# Patient Record
Sex: Female | Born: 1950 | Race: White | Hispanic: No | Marital: Married | Smoking: Current every day smoker
Health system: Southern US, Community
[De-identification: ages and names within clinical notes are randomized; demographics above are authoritative.]

## PROBLEM LIST (undated history)

## (undated) DIAGNOSIS — I10 Essential (primary) hypertension: Secondary | ICD-10-CM

## (undated) DIAGNOSIS — F909 Attention-deficit hyperactivity disorder, unspecified type: Secondary | ICD-10-CM

## (undated) DIAGNOSIS — I739 Peripheral vascular disease, unspecified: Secondary | ICD-10-CM

## (undated) DIAGNOSIS — M810 Age-related osteoporosis without current pathological fracture: Secondary | ICD-10-CM

## (undated) DIAGNOSIS — F32A Depression, unspecified: Secondary | ICD-10-CM

## (undated) DIAGNOSIS — I2510 Atherosclerotic heart disease of native coronary artery without angina pectoris: Secondary | ICD-10-CM

## (undated) DIAGNOSIS — F329 Major depressive disorder, single episode, unspecified: Secondary | ICD-10-CM

## (undated) DIAGNOSIS — J449 Chronic obstructive pulmonary disease, unspecified: Secondary | ICD-10-CM

## (undated) DIAGNOSIS — Z720 Tobacco use: Secondary | ICD-10-CM

## (undated) HISTORY — DX: Chronic obstructive pulmonary disease, unspecified: J44.9

## (undated) HISTORY — DX: Depression, unspecified: F32.A

## (undated) HISTORY — DX: Attention-deficit hyperactivity disorder, unspecified type: F90.9

## (undated) HISTORY — DX: Peripheral vascular disease, unspecified: I73.9

## (undated) HISTORY — DX: Age-related osteoporosis without current pathological fracture: M81.0

## (undated) HISTORY — DX: Atherosclerotic heart disease of native coronary artery without angina pectoris: I25.10

## (undated) HISTORY — PX: OVARY SURGERY: SHX727

## (undated) HISTORY — DX: Essential (primary) hypertension: I10

## (undated) HISTORY — DX: Tobacco use: Z72.0

---
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
## 1973-02-22 HISTORY — PX: APPENDECTOMY: SHX54

## 1973-02-22 HISTORY — PX: TOTAL ABDOMINAL HYSTERECTOMY: SHX209

## 1983-02-23 HISTORY — PX: VENTRAL HERNIA REPAIR: SHX424

## 2002-09-05 ENCOUNTER — Emergency Department (HOSPITAL_COMMUNITY): Admission: EM | Admit: 2002-09-05 | Discharge: 2002-09-06 | Payer: Self-pay | Admitting: Emergency Medicine

## 2004-04-28 ENCOUNTER — Emergency Department (HOSPITAL_COMMUNITY): Admission: EM | Admit: 2004-04-28 | Discharge: 2004-04-28 | Payer: Self-pay | Admitting: Emergency Medicine

## 2005-02-22 HISTORY — PX: CHOLECYSTECTOMY: SHX55

## 2005-06-08 ENCOUNTER — Emergency Department (HOSPITAL_COMMUNITY): Admission: EM | Admit: 2005-06-08 | Discharge: 2005-06-09 | Payer: Self-pay | Admitting: Emergency Medicine

## 2005-07-02 ENCOUNTER — Emergency Department (HOSPITAL_COMMUNITY): Admission: EM | Admit: 2005-07-02 | Discharge: 2005-07-02 | Payer: Self-pay | Admitting: Emergency Medicine

## 2005-07-10 ENCOUNTER — Emergency Department (HOSPITAL_COMMUNITY): Admission: EM | Admit: 2005-07-10 | Discharge: 2005-07-10 | Payer: Self-pay | Admitting: Emergency Medicine

## 2006-12-30 IMAGING — CR DG CHEST 1V PORT
1 series · 1 of 1 positions shown · non-contrast
Comparison: None.

CLINICAL DATA: Chest pain, shortness of breath.

PORTABLE CHEST - 1 VIEW  [DATE]/[id number] hours:

[view not recorded]
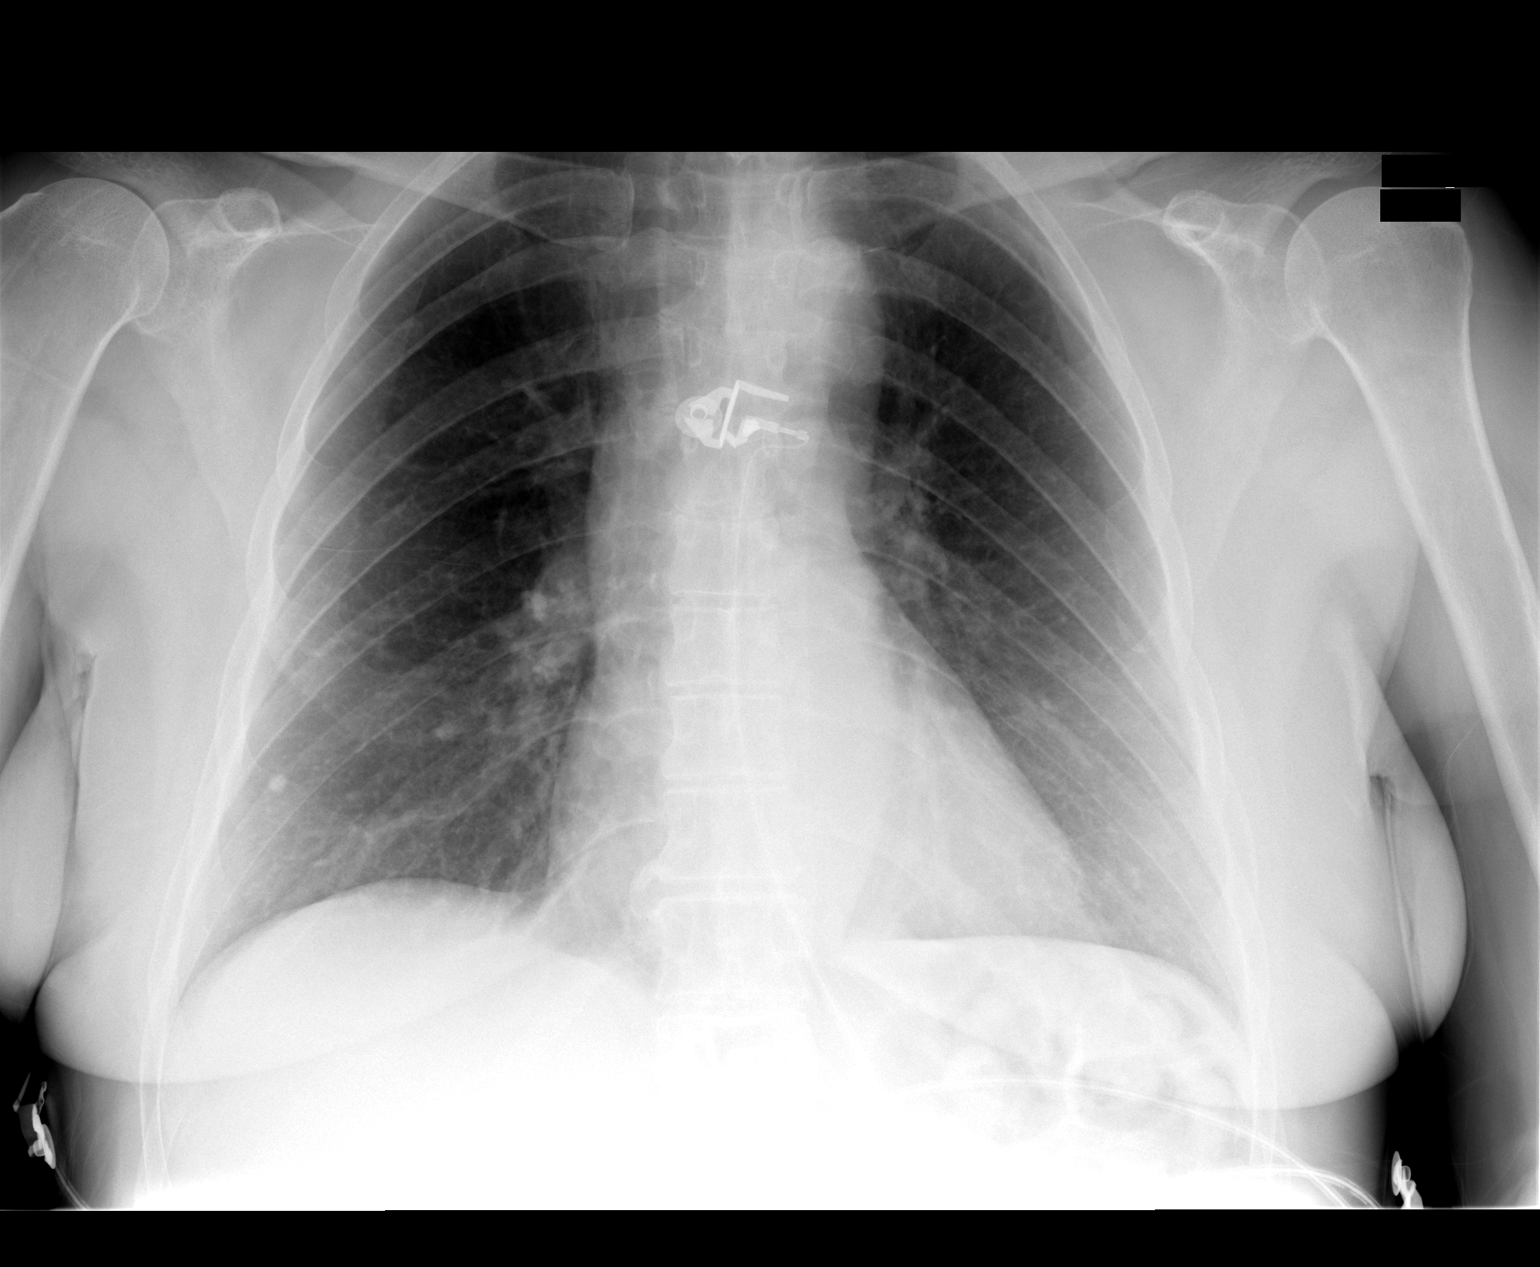

[1 of 1 positions shown; findings below may reference images not displayed]

FINDINGS: The heart size is normal. The thoracic aorta is mildly tortuous. The
hilar and mediastinal contours are otherwise unremarkable. A calcified granuloma
is present at the right lung base. The lungs are otherwise clear. There are no
pleural effusions.
IMPRESSION: Old granulomatous disease. No acute cardiopulmonary disease.

## 2008-02-23 HISTORY — PX: NECK SURGERY: SHX720

## 2009-01-09 ENCOUNTER — Ambulatory Visit (HOSPITAL_COMMUNITY): Admission: RE | Admit: 2009-01-09 | Discharge: 2009-01-10 | Payer: Self-pay | Admitting: Neurosurgery

## 2010-05-27 LAB — CBC
HCT: 42.5 % (ref 36.0–46.0)
MCHC: 35.4 g/dL (ref 30.0–36.0)
MCV: 95 fL (ref 78.0–100.0)

## 2010-05-27 LAB — URINALYSIS, ROUTINE W REFLEX MICROSCOPIC
Bilirubin Urine: NEGATIVE
Glucose, UA: NEGATIVE mg/dL
Nitrite: NEGATIVE
Protein, ur: NEGATIVE mg/dL
Specific Gravity, Urine: 1.021 (ref 1.005–1.030)
pH: 7 (ref 5.0–8.0)

## 2010-05-27 LAB — BASIC METABOLIC PANEL
Calcium: 9.2 mg/dL (ref 8.4–10.5)
Creatinine, Ser: 0.7 mg/dL (ref 0.4–1.2)
GFR calc Af Amer: >60 mL/min (ref >60)
GFR calc non Af Amer: >60 mL/min (ref >60)
Sodium: 140 mEq/L (ref 135–145)

## 2010-05-27 LAB — PROTIME-INR: INR: 1.02 (ref 0.00–1.49)

## 2018-09-19 HISTORY — PX: WRIST SURGERY: SHX841

## 2019-05-21 ENCOUNTER — Encounter: Payer: Self-pay | Admitting: *Deleted

## 2019-05-22 ENCOUNTER — Ambulatory Visit: Payer: Medicare HMO | Admitting: Cardiology

## 2019-05-22 ENCOUNTER — Encounter: Payer: Self-pay | Admitting: Cardiology

## 2019-05-22 ENCOUNTER — Telehealth: Payer: Self-pay | Admitting: Cardiology

## 2019-05-22 ENCOUNTER — Other Ambulatory Visit: Payer: Self-pay

## 2019-05-22 ENCOUNTER — Encounter: Payer: Self-pay | Admitting: *Deleted

## 2019-05-22 VITALS — BP 148/90 | HR 69 | Ht 63.5 in | Wt 151.0 lb

## 2019-05-22 DIAGNOSIS — R079 Chest pain, unspecified: Secondary | ICD-10-CM | POA: Diagnosis not present

## 2019-05-22 DIAGNOSIS — Z136 Encounter for screening for cardiovascular disorders: Secondary | ICD-10-CM

## 2019-05-22 NOTE — Progress Notes (Signed)
Clinical Summary Ms. Carrigan is a 69 y.o.female seen today as a new consult for the following medical problems.   1. Coronary  Atherosclerosis - 04/2019 CT chest incidental findings showed aortic atherosclerosis and 3 vessel CAD.  - can have some tightness in midchest. Can occur at rest. Moderate in severity. +SOB. Lasts a few minutes. Occurs few times a week - highest level of activity is hourse work, which she tolerates - no relation to eating. Not positional   CAD risk factors: Age, smoking, HTN   Past Medical History:  Diagnosis Date  . ADHD   . COPD (chronic obstructive pulmonary disease) ([place])   . Depression   . Hypertension   . Osteoporosis   . PVD (peripheral vascular disease) ([place])   . Tobacco abuse      Allergies  Allergen Reactions  . Ace Inhibitors Cough  . Sulfa Antibiotics Hives  . Penicillins Rash     Current Outpatient Medications  Medication Sig Dispense Refill  . alendronate (FOSAMAX) 70 MG tablet Take 70 mg by mouth once a week. Take with a full glass of water on an empty stomach.    Marland Kitchen buPROPion (WELLBUTRIN SR) 150 MG 12 hr tablet Take 150 mg by mouth 2 (two) times daily.    . calcium carbonate (CALTRATE 600) 1500 (600 Ca) MG TABS tablet Take 1,500 mg by mouth 2 (two) times daily with a meal.    . cyclobenzaprine (FLEXERIL) 10 MG tablet Take 10 mg by mouth 3 (three) times daily as needed for muscle spasms.    . DULoxetine (CYMBALTA) 60 MG capsule Take 60 mg by mouth 2 (two) times daily.    Marland Kitchen gabapentin (NEURONTIN) 300 MG capsule Take 300 mg by mouth 2 (two) times daily.    Marland Kitchen losartan-hydrochlorothiazide (HYZAAR) 100-12.5 MG tablet Take 1 tablet by mouth daily.    Marland Kitchen umeclidinium-vilanterol (ANORO ELLIPTA) 62.5-25 MCG/INH AEPB Inhale 1 puff into the lungs daily.     No current facility-administered medications for this visit.        Allergies  Allergen Reactions  . Ace Inhibitors Cough  . Sulfa Antibiotics Hives  . Penicillins Rash       Family History  Problem Relation Age of Onset  . Heart attack Father 79  . Heart attack Sister 50  . Hypertension Mother      Social History Ms. Limehouse reports that she has been smoking cigarettes. She has been smoking about 1.50 packs per day. She does not have any smokeless tobacco history on file. Ms. Fanta has no history on file for alcohol.   Review of Systems CONSTITUTIONAL: No weight loss, fever, chills, weakness or fatigue.  HEENT: Eyes: No visual loss, blurred vision, double vision or yellow sclerae.No hearing loss, sneezing, congestion, runny nose or sore throat.  SKIN: No rash or itching.  CARDIOVASCULAR: per hpi RESPIRATORY:per hpi GASTROINTESTINAL: No anorexia, nausea, vomiting or diarrhea. No abdominal pain or blood.  GENITOURINARY: No burning on urination, no polyuria NEUROLOGICAL: No headache, dizziness, syncope, paralysis, ataxia, numbness or tingling in the extremities. No change in bowel or bladder control.  MUSCULOSKELETAL: No muscle, back pain, joint pain or stiffness.  LYMPHATICS: No enlarged nodes. No history of splenectomy.  PSYCHIATRIC: No history of depression or anxiety.  ENDOCRINOLOGIC: No reports of sweating, cold or heat intolerance. No polyuria or polydipsia.  Marland Kitchen   Physical Examination Today's Vitals   05/22/19 1035  BP: (!) 148/90  Pulse: 69  SpO2: 97%  Weight: 151  lb (68.5 kg)  Height: 5' 3.5" (1.613 m)   Body mass index is 26.33 kg/m.  Gen: resting comfortably, no acute distress HEENT: no scleral icterus, pupils equal round and reactive, no palptable cervical adenopathy,  CV: RRR, no m/r/g no jvd Resp: Clear to auscultation bilaterally GI: abdomen is soft, non-tender, non-distended, normal bowel sounds, no hepatosplenomegaly MSK: extremities are warm, no edema.  Skin: warm, no rash Neuro:  no focal deficits Psych: appropriate affect     Assessment and Plan  1. Chest pain - incidental findings of 3 vessel CAD by recent  CT chest - reports some recent chest tightness, SOB - we will obtain a lexiscan to further evaluate for possible obstructive CAD - likely start ASA pending stress results - EKG today NSR, no ischemic changes  F/u pending stress results    Arnoldo Lenis, M.D.

## 2019-05-22 NOTE — Telephone Encounter (Signed)
Pre-cert Verification for the following procedure    LEXISCAN   DATE:06/01/2019  LOCATION: [facility]

## 2019-05-22 NOTE — Patient Instructions (Signed)
Your physician recommends that you schedule a follow-up appointment in: PENDING WITH DR BRANCH  Your physician recommends that you continue on your current medications as directed. Please refer to the Current Medication list given to you today.  Your physician has requested that you have a lexiscan myoview. For further information please visit [email or web address]. Please follow instruction sheet, as given.  Thank you for choosing [facility]!!    

## 2019-06-01 ENCOUNTER — Encounter (HOSPITAL_COMMUNITY)
Admission: RE | Admit: 2019-06-01 | Discharge: 2019-06-01 | Disposition: A | Discharge status: Home / Self Care | Payer: Medicare Other | Source: Ambulatory Visit | Attending: Cardiology | Admitting: Cardiology

## 2019-06-01 ENCOUNTER — Encounter (HOSPITAL_COMMUNITY): Payer: Self-pay

## 2019-06-01 ENCOUNTER — Other Ambulatory Visit: Payer: Self-pay

## 2019-06-01 ENCOUNTER — Encounter (HOSPITAL_BASED_OUTPATIENT_CLINIC_OR_DEPARTMENT_OTHER)
Admission: RE | Admit: 2019-06-01 | Discharge: 2019-06-01 | Disposition: A | Discharge status: Home / Self Care | Payer: Medicare Other | Source: Ambulatory Visit | Attending: Cardiology | Admitting: Cardiology

## 2019-06-01 DIAGNOSIS — R079 Chest pain, unspecified: Secondary | ICD-10-CM | POA: Diagnosis not present

## 2019-06-01 LAB — NM MYOCAR MULTI W/SPECT W/WALL MOTION / EF
LV dias vol: 91 mL (ref 46–106)
LV sys vol: 33 mL
Peak HR: 82 {beats}/min
RATE: 0.3
Rest HR: 55 {beats}/min
SDS: 6
SRS: 0
SSS: 6
TID: 1.21

## 2019-06-01 MED ORDER — TECHNETIUM TC 99M TETROFOSMIN IV KIT
30.0000 | PACK | Freq: Once | INTRAVENOUS | Status: AC | PRN
  Administered 2019-06-01: 11:00:00 29 via INTRAVENOUS

## 2019-06-01 MED ORDER — SODIUM CHLORIDE FLUSH 0.9 % IV SOLN
INTRAVENOUS | Status: AC
  Administered 2019-06-01: 11:00:00 10 mL via INTRAVENOUS
  Filled 2019-06-01: qty 10

## 2019-06-01 MED ORDER — TECHNETIUM TC 99M TETROFOSMIN IV KIT
10.0000 | PACK | Freq: Once | INTRAVENOUS | Status: AC | PRN
  Administered 2019-06-01: 09:00:00 10.3 via INTRAVENOUS

## 2019-06-01 MED ORDER — REGADENOSON 0.4 MG/5ML IV SOLN
INTRAVENOUS | Status: AC
  Administered 2019-06-01: 11:00:00 0.4 mg via INTRAVENOUS
  Filled 2019-06-01: qty 5

## 2019-06-07 ENCOUNTER — Telehealth: Payer: Self-pay | Admitting: Cardiology

## 2019-06-07 NOTE — Telephone Encounter (Signed)
Patient called requesting test results of recent stress test.  Please call [phone]

## 2019-06-07 NOTE — Telephone Encounter (Signed)
Pt voiced understanding - will add to schedule - routed to pcp   Stress test suggest a possible small area of blockage. Can we do a virtual Friday at 840AM to discuss findings and reassess symptoms, this is likely something we can treat with medications for now   Zandra Abts MD

## 2019-06-08 ENCOUNTER — Other Ambulatory Visit (HOSPITAL_COMMUNITY): Payer: Medicare Other

## 2019-06-08 ENCOUNTER — Telehealth: Payer: Self-pay | Admitting: Cardiology

## 2019-06-08 ENCOUNTER — Other Ambulatory Visit: Payer: Self-pay | Admitting: Cardiology

## 2019-06-08 ENCOUNTER — Encounter: Payer: Self-pay | Admitting: Cardiology

## 2019-06-08 ENCOUNTER — Telehealth (INDEPENDENT_AMBULATORY_CARE_PROVIDER_SITE_OTHER): Payer: Medicare Other | Admitting: Cardiology

## 2019-06-08 VITALS — Ht 63.5 in | Wt 151.0 lb

## 2019-06-08 DIAGNOSIS — R0789 Other chest pain: Secondary | ICD-10-CM | POA: Diagnosis not present

## 2019-06-08 MED ORDER — NITROGLYCERIN 0.4 MG SL SUBL: 0.4000 mg | SUBLINGUAL_TABLET | SUBLINGUAL | 3 refills | Status: AC | PRN

## 2019-06-08 MED ORDER — SODIUM CHLORIDE 0.9% FLUSH: 3.0000 mL | Freq: Two times a day (BID) | INTRAVENOUS | Status: DC

## 2019-06-08 MED ORDER — ISOSORBIDE MONONITRATE ER 30 MG PO TB24: 15.0000 mg | ORAL_TABLET | Freq: Every day | ORAL | 1 refills | Status: DC

## 2019-06-08 NOTE — Telephone Encounter (Signed)
Pre-cert Verification for the following procedure    [facility] 4/20 @7 :30 AM DR Claiborne Billings

## 2019-06-08 NOTE — Patient Instructions (Addendum)
Your physician recommends that you schedule a follow-up appointment in: [place] has recommended you make the following change in your medication:   START ASPIRIN 81 MG DAILY   START IMDUR 15 MG (1/2 TABLET) DAILY   TAKE NITROGLYCERIN 0.4 MG UNDER THE TONGUE EVERY 5-10 MINS AS NEEDED FOR CHEST PAIN  COVID TEST SCHEDULED FOR 06/11/2019 @ 8:55AM ACROSS FROM [place]  Your physician has requested that you have a cardiac catheterization SCHEDULED AT [facility] Tuesday 06/12/2019 PLEASE ARRIVE AT 5:30AM SHORT STAY ENTRANCE A - TAKE YOUR ASPIRIN AND ALL OTHER MEDICATION WITH SIPS OF WATER, MUST HAVE SOMEONE TO DRIVE YOU HOME, BRING AN OVER NIGHT BAGS AND MEDICATIONS, NOTHING TO EAT OR DRINK AFTER MIDNIGHT. Cardiac catheterization is used to diagnose and/or treat various heart conditions. Doctors may recommend this procedure for a number of different reasons. The most common reason is to evaluate chest pain. Chest pain can be a symptom of coronary artery disease (CAD), and cardiac catheterization can show whether plaque is narrowing or blocking your heart's arteries. This procedure is also used to evaluate the valves, as well as measure the blood flow and oxygen levels in different parts of your heart. For further information please visit [email or web address]. Please follow instruction sheet, as given.  Your physician recommends that you return for lab work CBC/BMP Monday AT [facility]   Thank you for choosing [facility]!!

## 2019-06-08 NOTE — H&P (View-Only) (Signed)
Virtual Visit via Telephone Note   This visit type was conducted due to national recommendations for restrictions regarding the COVID-19 Pandemic (e.g. social distancing) in an effort to limit this patient's exposure and mitigate transmission in our community.  Due to her co-morbid illnesses, this patient is at least at moderate risk for complications without adequate follow up.  This format is felt to be most appropriate for this patient at this time.  The patient did not have access to video technology/had technical difficulties with video requiring transitioning to audio format only (telephone).  All issues noted in this document were discussed and addressed.  No physical exam could be performed with this format.  Please refer to the patient's chart for her  consent to telehealth for [facility].   The patient was identified using 2 identifiers.  Date:  06/08/2019   ID:  Nimsy, Dross [birth year], MRN [id number]  Patient Location: Home Provider Location: Office  PCP:  Caryl Bis, MD  Cardiologist:  Dr Carlyle Dolly MD Electrophysiologist:  None   Evaluation Performed:  Follow-Up Visit  Chief Complaint:  Follow up visit  History of Present Illness:    Paige Zimmerman is a 69 y.o. female seen today for follow up of the following medical problems.  1. Coronary  Atherosclerosis - 04/2019 CT chest incidental findings showed aortic atherosclerosis and 3 vessel CAD.  - can have some tightness in midchest. Can occur at rest. Moderate in severity. +SOB. Lasts a few minutes. Occurs few times a week - highest level of activity is hourse work, which she tolerates - no relation to eating. Not positional   CAD risk factors: Age, smoking x 50 years, HTN,   - 05/2019 nuclear stress mild to moderate ischemia -since last visit some progression of symptoms. - chest pain has some increase in frequency. Symptoms with lower levels of exertion. Some     The patient does not have  symptoms concerning for COVID-19 infection (fever, chills, cough, or new shortness of breath).    Past Medical History:  Diagnosis Date  . ADHD   . COPD (chronic obstructive pulmonary disease) ([place])   . Depression   . Hypertension   . Osteoporosis   . PVD (peripheral vascular disease) ([place])   . Tobacco abuse       Current Meds  Medication Sig  . ALBUTEROL IN Inhale into the lungs.  Marland Kitchen alendronate (FOSAMAX) 70 MG tablet Take 70 mg by mouth once a week. Take with a full glass of water on an empty stomach.  Marland Kitchen buPROPion (WELLBUTRIN SR) 150 MG 12 hr tablet Take 150 mg by mouth 2 (two) times daily.  . calcium carbonate (CALTRATE 600) 1500 (600 Ca) MG TABS tablet Take 1,500 mg by mouth 2 (two) times daily with a meal.  . cetirizine (ZYRTEC) 10 MG tablet Take 10 mg by mouth daily.  . DULoxetine (CYMBALTA) 60 MG capsule Take 60 mg by mouth 2 (two) times daily.  Marland Kitchen gabapentin (NEURONTIN) 300 MG capsule Take 300 mg by mouth 2 (two) times daily.  Marland Kitchen losartan-hydrochlorothiazide (HYZAAR) 100-12.5 MG tablet Take 1 tablet by mouth daily.  Marland Kitchen umeclidinium-vilanterol (ANORO ELLIPTA) 62.5-25 MCG/INH AEPB Inhale 1 puff into the lungs daily.     Allergies:   Ace inhibitors, Sulfa antibiotics, and Penicillins   Social History   Tobacco Use  . Smoking status: Current Every Day Smoker    Packs/day: 1.50    Types: Cigarettes  . Smokeless tobacco: Never Used  Substance Use Topics  . Alcohol use: Not on file  . Drug use: Not on file     Family Hx: The patient's family history includes Heart attack (age of onset: 79) in her sister; Heart attack (age of onset: 28) in her father; Hypertension in her mother.  ROS:   Please see the history of present illness.     All other systems reviewed and are negative.   Prior CV studies:   The following studies were reviewed today:  05/2019 nuclear stress  There was no ST segment deviation noted during stress.  Findings consistent with mild to moderate  anterior ischemia.  The left ventricular ejection fraction is normal (55-65%).  Low to intermediate risk study  Labs/Other Tests and Data Reviewed:    EKG:  No ECG reviewed.  Recent Labs: No results found for requested labs within last 8760 hours.   Recent Lipid Panel No results found for: CHOL, TRIG, HDL, CHOLHDL, LDLCALC, LDLDIRECT  Wt Readings from Last 3 Encounters:  06/08/19 151 lb (68.5 kg)  05/22/19 151 lb (68.5 kg)     Objective:    Vital Signs:  Ht 5' 3.5" (1.613 m)   Wt 151 lb (68.5 kg)   BMI 26.33 kg/m    Normal affect. Normal speech pattern and tone. Comfortable, no apparent distress. No audible signs of sob or wheezing.   ASSESSMENT & PLAN:    1. Chest pain - incidental findings of 3 vessel CAD by recent CT chest - nuclear stress with mild to moderate anterior ischemia - some gradual progression of symptoms of the last few weeks - given progression of symptoms will plan for heart cath - start ASA 81mg , imdur 15mg , and SL NG prn.    I have reviewed the risks, indications, and alternatives to cardiac catheterization, possible angioplasty, and stenting with the patient and her daughter today. Risks include but are not limited to bleeding, infection, vascular injury, stroke, myocardial infection, arrhythmia, kidney injury, radiation-related injury in the case of prolonged fluoroscopy use, emergency cardiac surgery, and death. The patient understands the risks of serious complication is 1-2 in 123XX123 with diagnostic cardiac cath and 1-2% or less with angioplasty/stenting.   COVID-19 Education: The signs and symptoms of COVID-19 were discussed with the patient and how to seek care for testing (follow up with PCP or arrange E-visit).  The importance of social distancing was discussed today.  Time:   Today, I have spent 21 minutes with the patient with telehealth technology discussing the above problems.     Medication Adjustments/Labs and Tests Ordered: Current  medicines are reviewed at length with the patient today.  Concerns regarding medicines are outlined above.   Tests Ordered: No orders of the defined types were placed in this encounter.   Medication Changes: No orders of the defined types were placed in this encounter.   Follow Up:  In Person in 3 week(s)  Signed, Carlyle Dolly, MD  06/08/2019 9:33 AM    [place]

## 2019-06-08 NOTE — Progress Notes (Addendum)
Virtual Visit via Telephone Note   This visit type was conducted due to national recommendations for restrictions regarding the COVID-19 Pandemic (e.g. social distancing) in an effort to limit this patient's exposure and mitigate transmission in our community.  Due to her co-morbid illnesses, this patient is at least at moderate risk for complications without adequate follow up.  This format is felt to be most appropriate for this patient at this time.  The patient did not have access to video technology/had technical difficulties with video requiring transitioning to audio format only (telephone).  All issues noted in this document were discussed and addressed.  No physical exam could be performed with this format.  Please refer to the patient's chart for her  consent to telehealth for [facility].   The patient was identified using 2 identifiers.  Date:  06/08/2019   ID:  Paige, Zimmerman November 15, 1950, MRN [id number]  Patient Location: Home Provider Location: Office  PCP:  Caryl Bis, MD  Cardiologist:  Dr Carlyle Dolly MD Electrophysiologist:  None   Evaluation Performed:  Follow-Up Visit  Chief Complaint:  Follow up visit  History of Present Illness:    Paige Zimmerman is a 69 y.o. female seen today for follow up of the following medical problems.  1. Coronary  Atherosclerosis - 04/2019 CT chest incidental findings showed aortic atherosclerosis and 3 vessel CAD.  - can have some tightness in midchest. Can occur at rest. Moderate in severity. +SOB. Lasts a few minutes. Occurs few times a week - highest level of activity is hourse work, which she tolerates - no relation to eating. Not positional   CAD risk factors: Age, smoking x 50 years, HTN,   - 05/2019 nuclear stress mild to moderate ischemia -since last visit some progression of symptoms. - chest pain has some increase in frequency. Symptoms with lower levels of exertion. Some     The patient does not have  symptoms concerning for COVID-19 infection (fever, chills, cough, or new shortness of breath).    Past Medical History:  Diagnosis Date  . ADHD   . COPD (chronic obstructive pulmonary disease) ([place])   . Depression   . Hypertension   . Osteoporosis   . PVD (peripheral vascular disease) ([place])   . Tobacco abuse       Current Meds  Medication Sig  . ALBUTEROL IN Inhale into the lungs.  Marland Kitchen alendronate (FOSAMAX) 70 MG tablet Take 70 mg by mouth once a week. Take with a full glass of water on an empty stomach.  Marland Kitchen buPROPion (WELLBUTRIN SR) 150 MG 12 hr tablet Take 150 mg by mouth 2 (two) times daily.  . calcium carbonate (CALTRATE 600) 1500 (600 Ca) MG TABS tablet Take 1,500 mg by mouth 2 (two) times daily with a meal.  . cetirizine (ZYRTEC) 10 MG tablet Take 10 mg by mouth daily.  . DULoxetine (CYMBALTA) 60 MG capsule Take 60 mg by mouth 2 (two) times daily.  Marland Kitchen gabapentin (NEURONTIN) 300 MG capsule Take 300 mg by mouth 2 (two) times daily.  Marland Kitchen losartan-hydrochlorothiazide (HYZAAR) 100-12.5 MG tablet Take 1 tablet by mouth daily.  Marland Kitchen umeclidinium-vilanterol (ANORO ELLIPTA) 62.5-25 MCG/INH AEPB Inhale 1 puff into the lungs daily.     Allergies:   Ace inhibitors, Sulfa antibiotics, and Penicillins   Social History   Tobacco Use  . Smoking status: Current Every Day Smoker    Packs/day: 1.50    Types: Cigarettes  . Smokeless tobacco: Never Used  Substance Use Topics  . Alcohol use: Not on file  . Drug use: Not on file     Family Hx: The patient's family history includes Heart attack (age of onset: 18) in her sister; Heart attack (age of onset: 74) in her father; Hypertension in her mother.  ROS:   Please see the history of present illness.     All other systems reviewed and are negative.   Prior CV studies:   The following studies were reviewed today:  05/2019 nuclear stress  There was no ST segment deviation noted during stress.  Findings consistent with mild to moderate  anterior ischemia.  The left ventricular ejection fraction is normal (55-65%).  Low to intermediate risk study  Labs/Other Tests and Data Reviewed:    EKG:  No ECG reviewed.  Recent Labs: No results found for requested labs within last 8760 hours.   Recent Lipid Panel No results found for: CHOL, TRIG, HDL, CHOLHDL, LDLCALC, LDLDIRECT  Wt Readings from Last 3 Encounters:  06/08/19 151 lb (68.5 kg)  05/22/19 151 lb (68.5 kg)     Objective:    Vital Signs:  Ht 5' 3.5" (1.613 m)   Wt 151 lb (68.5 kg)   BMI 26.33 kg/m    Normal affect. Normal speech pattern and tone. Comfortable, no apparent distress. No audible signs of sob or wheezing.   ASSESSMENT & PLAN:    1. Chest pain - incidental findings of 3 vessel CAD by recent CT chest - nuclear stress with mild to moderate anterior ischemia - some gradual progression of symptoms of the last few weeks - given progression of symptoms will plan for heart cath - start ASA 81mg , imdur 15mg , and SL NG prn.    I have reviewed the risks, indications, and alternatives to cardiac catheterization, possible angioplasty, and stenting with the patient and her daughter today. Risks include but are not limited to bleeding, infection, vascular injury, stroke, myocardial infection, arrhythmia, kidney injury, radiation-related injury in the case of prolonged fluoroscopy use, emergency cardiac surgery, and death. The patient understands the risks of serious complication is 1-2 in 123XX123 with diagnostic cardiac cath and 1-2% or less with angioplasty/stenting.   COVID-19 Education: The signs and symptoms of COVID-19 were discussed with the patient and how to seek care for testing (follow up with PCP or arrange E-visit).  The importance of social distancing was discussed today.  Time:   Today, I have spent 21 minutes with the patient with telehealth technology discussing the above problems.     Medication Adjustments/Labs and Tests Ordered: Current  medicines are reviewed at length with the patient today.  Concerns regarding medicines are outlined above.   Tests Ordered: No orders of the defined types were placed in this encounter.   Medication Changes: No orders of the defined types were placed in this encounter.   Follow Up:  In Person in 3 week(s)  Signed, Carlyle Dolly, MD  06/08/2019 9:33 AM    [place]

## 2019-06-11 ENCOUNTER — Telehealth: Payer: Self-pay | Admitting: Cardiology

## 2019-06-11 ENCOUNTER — Other Ambulatory Visit: Payer: Self-pay | Admitting: Cardiology

## 2019-06-11 ENCOUNTER — Other Ambulatory Visit (HOSPITAL_COMMUNITY)
Admission: RE | Admit: 2019-06-11 | Discharge: 2019-06-11 | Disposition: A | Discharge status: Home / Self Care | Payer: Medicare Other | Source: Ambulatory Visit | Attending: Cardiovascular Disease | Admitting: Cardiovascular Disease

## 2019-06-11 ENCOUNTER — Other Ambulatory Visit: Payer: Self-pay

## 2019-06-11 ENCOUNTER — Other Ambulatory Visit (HOSPITAL_COMMUNITY)
Admission: RE | Admit: 2019-06-11 | Discharge: 2019-06-11 | Disposition: A | Discharge status: Home / Self Care | Payer: Medicare Other | Source: Ambulatory Visit | Attending: Cardiology | Admitting: Cardiology

## 2019-06-11 DIAGNOSIS — R0789 Other chest pain: Secondary | ICD-10-CM | POA: Insufficient documentation

## 2019-06-11 LAB — BASIC METABOLIC PANEL
Anion gap: 10 (ref 5–15)
BUN: 18 mg/dL (ref 8–23)
CO2: 27 mmol/L (ref 22–32)
Calcium: 9.1 mg/dL (ref 8.9–10.3)
Chloride: 99 mmol/L (ref 98–111)
Creatinine, Ser: 0.63 mg/dL (ref 0.44–1.00)
GFR calc Af Amer: >60 mL/min (ref >60)
GFR calc non Af Amer: >60 mL/min (ref >60)
Glucose, Bld: 113 mg/dL — ABNORMAL HIGH (ref 70–99)
Potassium: 4.1 mmol/L (ref 3.5–5.1)
Sodium: 136 mmol/L (ref 135–145)

## 2019-06-11 LAB — CBC
HCT: 43.6 % (ref 36.0–46.0)
Hemoglobin: 14.4 g/dL (ref 12.0–15.0)
MCH: 32.4 pg (ref 26.0–34.0)
MCHC: 33 g/dL (ref 30.0–36.0)
MCV: 98 fL (ref 80.0–100.0)
Platelets: 253 10*3/uL (ref 150–400)
RBC: 4.45 MIL/uL (ref 3.87–5.11)
RDW: 13.3 % (ref 11.5–15.5)
WBC: 4.8 10*3/uL (ref 4.0–10.5)
nRBC: 0 % (ref 0.0–0.2)

## 2019-06-11 NOTE — Telephone Encounter (Signed)
Patient called stating that her daughter spoke with Dr. Harl Bowie  In regards to cancelling her upcoming cath for 06/12/2019.

## 2019-06-11 NOTE — Telephone Encounter (Addendum)
Request to change heart cath date until Friday New appointment for heart cath is Friday, 06/15/19 10:30 am with Irish Lack

## 2019-06-11 NOTE — Telephone Encounter (Signed)
Patient informed and verbalized understanding of new date and times for covid test and heart cath.

## 2019-06-12 ENCOUNTER — Other Ambulatory Visit: Payer: Self-pay

## 2019-06-12 ENCOUNTER — Other Ambulatory Visit (HOSPITAL_COMMUNITY)
Admission: RE | Admit: 2019-06-12 | Discharge: 2019-06-12 | Disposition: A | Discharge status: Home / Self Care | Payer: Medicare Other | Source: Ambulatory Visit | Attending: Cardiovascular Disease | Admitting: Cardiovascular Disease

## 2019-06-12 DIAGNOSIS — Z01812 Encounter for preprocedural laboratory examination: Secondary | ICD-10-CM | POA: Diagnosis not present

## 2019-06-12 DIAGNOSIS — Z20822 Contact with and (suspected) exposure to covid-19: Secondary | ICD-10-CM | POA: Insufficient documentation

## 2019-06-13 LAB — SARS CORONAVIRUS 2 (TAT 6-24 HRS): SARS Coronavirus 2: NEGATIVE

## 2019-06-15 ENCOUNTER — Other Ambulatory Visit: Payer: Self-pay

## 2019-06-15 ENCOUNTER — Encounter (HOSPITAL_COMMUNITY)
Admission: RE | Disposition: A | Discharge status: Home / Self Care | Payer: Self-pay | Source: Home / Self Care | Attending: Interventional Cardiology

## 2019-06-15 ENCOUNTER — Ambulatory Visit (HOSPITAL_COMMUNITY)
Admission: RE | Admit: 2019-06-15 | Discharge: 2019-06-15 | Disposition: A | Discharge status: Home / Self Care | Payer: Medicare Other | Attending: Interventional Cardiology | Admitting: Interventional Cardiology

## 2019-06-15 ENCOUNTER — Telehealth: Payer: Self-pay | Admitting: *Deleted

## 2019-06-15 DIAGNOSIS — F17210 Nicotine dependence, cigarettes, uncomplicated: Secondary | ICD-10-CM | POA: Diagnosis not present

## 2019-06-15 DIAGNOSIS — I2510 Atherosclerotic heart disease of native coronary artery without angina pectoris: Secondary | ICD-10-CM | POA: Insufficient documentation

## 2019-06-15 DIAGNOSIS — M810 Age-related osteoporosis without current pathological fracture: Secondary | ICD-10-CM | POA: Insufficient documentation

## 2019-06-15 DIAGNOSIS — R9439 Abnormal result of other cardiovascular function study: Secondary | ICD-10-CM | POA: Diagnosis not present

## 2019-06-15 DIAGNOSIS — Z8249 Family history of ischemic heart disease and other diseases of the circulatory system: Secondary | ICD-10-CM | POA: Insufficient documentation

## 2019-06-15 DIAGNOSIS — R079 Chest pain, unspecified: Secondary | ICD-10-CM | POA: Diagnosis present

## 2019-06-15 DIAGNOSIS — Z880 Allergy status to penicillin: Secondary | ICD-10-CM | POA: Insufficient documentation

## 2019-06-15 DIAGNOSIS — J449 Chronic obstructive pulmonary disease, unspecified: Secondary | ICD-10-CM | POA: Diagnosis not present

## 2019-06-15 DIAGNOSIS — Z79899 Other long term (current) drug therapy: Secondary | ICD-10-CM | POA: Insufficient documentation

## 2019-06-15 DIAGNOSIS — R0789 Other chest pain: Secondary | ICD-10-CM

## 2019-06-15 DIAGNOSIS — F329 Major depressive disorder, single episode, unspecified: Secondary | ICD-10-CM | POA: Diagnosis not present

## 2019-06-15 DIAGNOSIS — I739 Peripheral vascular disease, unspecified: Secondary | ICD-10-CM | POA: Insufficient documentation

## 2019-06-15 DIAGNOSIS — Z882 Allergy status to sulfonamides status: Secondary | ICD-10-CM | POA: Diagnosis not present

## 2019-06-15 DIAGNOSIS — I10 Essential (primary) hypertension: Secondary | ICD-10-CM | POA: Insufficient documentation

## 2019-06-15 HISTORY — PX: LEFT HEART CATH AND CORONARY ANGIOGRAPHY: CATH118249

## 2019-06-15 SURGERY — LEFT HEART CATH AND CORONARY ANGIOGRAPHY
Anesthesia: LOCAL

## 2019-06-15 MED ORDER — ONDANSETRON HCL 4 MG/2ML IJ SOLN: 4.0000 mg | Freq: Four times a day (QID) | INTRAMUSCULAR | Status: DC | PRN

## 2019-06-15 MED ORDER — HEPARIN SODIUM (PORCINE) 1000 UNIT/ML IJ SOLN
INTRAMUSCULAR | Status: AC
  Filled 2019-06-15: qty 1

## 2019-06-15 MED ORDER — LIDOCAINE HCL (PF) 1 % IJ SOLN
INTRAMUSCULAR | Status: AC
  Filled 2019-06-15: qty 30

## 2019-06-15 MED ORDER — SODIUM CHLORIDE 0.9% FLUSH: 3.0000 mL | Freq: Two times a day (BID) | INTRAVENOUS | Status: DC

## 2019-06-15 MED ORDER — MIDAZOLAM HCL 2 MG/2ML IJ SOLN
INTRAMUSCULAR | Status: AC
  Filled 2019-06-15: qty 2

## 2019-06-15 MED ORDER — SODIUM CHLORIDE 0.9 % IV SOLN: 250.0000 mL | INTRAVENOUS | Status: DC | PRN

## 2019-06-15 MED ORDER — MIDAZOLAM HCL 2 MG/2ML IJ SOLN
INTRAMUSCULAR | Status: DC | PRN
  Administered 2019-06-15: 2 mg via INTRAVENOUS

## 2019-06-15 MED ORDER — IOHEXOL 350 MG/ML SOLN
INTRAVENOUS | Status: DC | PRN
  Administered 2019-06-15: 11:00:00 60 mL

## 2019-06-15 MED ORDER — SODIUM CHLORIDE 0.9% FLUSH: 3.0000 mL | INTRAVENOUS | Status: DC | PRN

## 2019-06-15 MED ORDER — ACETAMINOPHEN 325 MG PO TABS: 650.0000 mg | ORAL_TABLET | ORAL | Status: DC | PRN

## 2019-06-15 MED ORDER — FENTANYL CITRATE (PF) 100 MCG/2ML IJ SOLN
INTRAMUSCULAR | Status: AC
  Filled 2019-06-15: qty 2

## 2019-06-15 MED ORDER — SODIUM CHLORIDE 0.9 % WEIGHT BASED INFUSION
1.0000 mL/kg/h | INTRAVENOUS | Status: DC
  Administered 2019-06-15: 1 mL/kg/h via INTRAVENOUS

## 2019-06-15 MED ORDER — LABETALOL HCL 5 MG/ML IV SOLN: 10.0000 mg | INTRAVENOUS | Status: DC | PRN

## 2019-06-15 MED ORDER — HYDRALAZINE HCL 20 MG/ML IJ SOLN: 10.0000 mg | INTRAMUSCULAR | Status: DC | PRN

## 2019-06-15 MED ORDER — ASPIRIN 81 MG PO CHEW: 81.0000 mg | CHEWABLE_TABLET | ORAL | Status: DC

## 2019-06-15 MED ORDER — HEPARIN SODIUM (PORCINE) 1000 UNIT/ML IJ SOLN
INTRAMUSCULAR | Status: DC | PRN
  Administered 2019-06-15: 3500 [IU] via INTRAVENOUS

## 2019-06-15 MED ORDER — HEPARIN (PORCINE) IN NACL 1000-0.9 UT/500ML-% IV SOLN
INTRAVENOUS | Status: AC
  Filled 2019-06-15: qty 500

## 2019-06-15 MED ORDER — LIDOCAINE HCL (PF) 1 % IJ SOLN
INTRAMUSCULAR | Status: DC | PRN
  Administered 2019-06-15: 2 mL

## 2019-06-15 MED ORDER — FENTANYL CITRATE (PF) 100 MCG/2ML IJ SOLN
INTRAMUSCULAR | Status: DC | PRN
  Administered 2019-06-15: 25 ug via INTRAVENOUS

## 2019-06-15 MED ORDER — HEPARIN (PORCINE) IN NACL 1000-0.9 UT/500ML-% IV SOLN
INTRAVENOUS | Status: DC | PRN
  Administered 2019-06-15 (×2): 500 mL

## 2019-06-15 MED ORDER — VERAPAMIL HCL 2.5 MG/ML IV SOLN
INTRAVENOUS | Status: AC
  Filled 2019-06-15: qty 4

## 2019-06-15 MED ORDER — SODIUM CHLORIDE 0.9 % WEIGHT BASED INFUSION
3.0000 mL/kg/h | INTRAVENOUS | Status: AC
  Administered 2019-06-15: 09:00:00 3 mL/kg/h via INTRAVENOUS

## 2019-06-15 MED ORDER — VERAPAMIL HCL 2.5 MG/ML IV SOLN
INTRAVENOUS | Status: DC | PRN
  Administered 2019-06-15: 10 mL via INTRA_ARTERIAL

## 2019-06-15 MED ORDER — SODIUM CHLORIDE 0.9 % IV SOLN: INTRAVENOUS | Status: DC

## 2019-06-15 SURGICAL SUPPLY — 10 items
CATH 5FR JL3.5 JR4 ANG PIG MP (CATHETERS) ×1 IMPLANT
DEVICE RAD COMP TR BAND LRG (VASCULAR PRODUCTS) ×1 IMPLANT
GLIDESHEATH SLEND SS 6F .021 (SHEATH) ×1 IMPLANT
GUIDEWIRE INQWIRE 1.5J.035X260 (WIRE) IMPLANT
INQWIRE 1.5J .035X260CM (WIRE) ×2
KIT HEART LEFT (KITS) ×2 IMPLANT
PACK CARDIAC CATHETERIZATION (CUSTOM PROCEDURE TRAY) ×2 IMPLANT
TRANSDUCER W/STOPCOCK (MISCELLANEOUS) ×2 IMPLANT
TUBING CIL FLEX 10 FLL-RA (TUBING) ×2 IMPLANT
WIRE HI TORQ VERSACORE-J 145CM (WIRE) ×1 IMPLANT

## 2019-06-15 NOTE — Discharge Instructions (Signed)
Drink plenty of fluid for 48 hours and keep wrist elevated at heart level for 24 hours  Radial Site Care   This sheet gives you information about how to care for yourself after your procedure. Your health care provider may also give you more specific instructions. If you have problems or questions, contact your health care provider. What can I expect after the procedure? After the procedure, it is common to have:  Bruising and tenderness at the catheter insertion area. Follow these instructions at home: Medicines  Take over-the-counter and prescription medicines only as told by your health care provider. Insertion site care 1. Follow instructions from your health care provider about how to take care of your insertion site. Make sure you: ? Wash your hands with soap and water before you change your bandage (dressing). If soap and water are not available, use hand sanitizer. ? remove your dressing as told by your health care provider. In 24 hours 2. Check your insertion site every day for signs of infection. Check for: ? Redness, swelling, or pain. ? Fluid or blood. ? Pus or a bad smell. ? Warmth. 3. Do not take baths, swim, or use a hot tub until your health care provider approves. 4. You may shower 24-48 hours after the procedure, or as directed by your health care provider. ? Remove the dressing and gently wash the site with plain soap and water. ? Pat the area dry with a clean towel. ? Do not rub the site. That could cause bleeding. 5. Do not apply powder or lotion to the site. Activity   1. For 24 hours after the procedure, or as directed by your health care provider: ? Do not flex or bend the affected arm. ? Do not push or pull heavy objects with the affected arm. ? Do not drive yourself home from the hospital or clinic. You may drive 24 hours after the procedure unless your health care provider tells you not to. ? Do not operate machinery or power tools. 2. Do not lift  anything that is heavier than 10 lb (4.5 kg), or the limit that you are told, until your health care provider says that it is safe. For 4 days 3. Ask your health care provider when it is okay to: ? Return to work or school. ? Resume usual physical activities or sports. ? Resume sexual activity. General instructions  If the catheter site starts to bleed, raise your arm and put firm pressure on the site. If the bleeding does not stop, get help right away. This is a medical emergency.  If you went home on the same day as your procedure, a responsible adult should be with you for the first 24 hours after you arrive home.  Keep all follow-up visits as told by your health care provider. This is important. Contact a health care provider if:  You have a fever.  You have redness, swelling, or yellow drainage around your insertion site. Get help right away if:  You have unusual pain at the radial site.  The catheter insertion area swells very fast.  The insertion area is bleeding, and the bleeding does not stop when you hold steady pressure on the area.  Your arm or hand becomes pale, cool, tingly, or numb. These symptoms may represent a serious problem that is an emergency. Do not wait to see if the symptoms will go away. Get medical help right away. Call your local emergency services (911 in the [place] Do not   drive yourself to the hospital. Summary  After the procedure, it is common to have bruising and tenderness at the site.  Follow instructions from your health care provider about how to take care of your radial site wound. Check the wound every day for signs of infection.  Do not lift anything that is heavier than 10 lb (4.5 kg), or the limit that you are told, until your health care provider says that it is safe. This information is not intended to replace advice given to you by your health care provider. Make sure you discuss any questions you have with your health care  provider. Document Revised: 03/16/2017 Document Reviewed: 03/16/2017 Elsevier Patient Education  2020 [employer]  

## 2019-06-15 NOTE — Progress Notes (Signed)
Patient and daughter was given discharge instructions. Both verbalized understanding. 

## 2019-06-15 NOTE — Interval H&P Note (Signed)
Cath Lab Visit (complete for each Cath Lab visit)  Clinical Evaluation Leading to the Procedure:   ACS: No.  Non-ACS:    Anginal Classification: CCS III  Anti-ischemic medical therapy: Minimal Therapy (1 class of medications)  Non-Invasive Test Results: Intermediate-risk stress test findings: cardiac mortality 1-3%/year  Prior CABG: No previous CABG      History and Physical Interval Note:  06/15/2019 10:36 AM  Paige Zimmerman  has presented today for surgery, with the diagnosis of abnormal stress test.  The various methods of treatment have been discussed with the patient and family. After consideration of risks, benefits and other options for treatment, the patient has consented to  Procedure(s): LEFT HEART CATH AND CORONARY ANGIOGRAPHY (N/A) as a surgical intervention.  The patient's history has been reviewed, patient examined, no change in status, stable for surgery.  I have reviewed the patient's chart and labs.  Questions were answered to the patient's satisfaction.     Paige Zimmerman

## 2019-06-15 NOTE — Telephone Encounter (Signed)
-----   Message from Arnoldo Lenis, MD sent at 06/12/2019 12:59 PM EDT ----- Normal labs  Zandra Abts MD

## 2019-06-15 NOTE — Telephone Encounter (Signed)
Pt aware.

## 2019-06-22 DIAGNOSIS — I10 Essential (primary) hypertension: Secondary | ICD-10-CM | POA: Diagnosis not present

## 2019-06-22 DIAGNOSIS — E7849 Other hyperlipidemia: Secondary | ICD-10-CM | POA: Diagnosis not present

## 2019-06-22 DIAGNOSIS — J449 Chronic obstructive pulmonary disease, unspecified: Secondary | ICD-10-CM | POA: Diagnosis not present

## 2019-06-29 ENCOUNTER — Ambulatory Visit: Payer: Medicare Other | Admitting: Cardiology

## 2019-07-11 NOTE — Progress Notes (Signed)
Cardiology Office Note  Date: 07/12/2019   ID: [name], [name] 09/16/1950, MRN [id number]  PCP:  Caryl Bis, MD  Cardiologist:  Carlyle Dolly, MD Electrophysiologist:  None   Chief Complaint: Follow-up chest pain, CAD  History of Present Illness: Paige Zimmerman is a 69 y.o. female with a history of chest pain, CAD.  Evidence of CAD on CT chest 05/12/2019 showing aortic atherosclerosis and three-vessel CAD  Last encounter with Dr. Harl Bowie 06/08/2018 via telemedicine.  Patient stated she could have some chest tightness in mid chest and occurs at rest moderate in severity with positive shortness of breath lasting for minutes occurring a few times a week.  Her highest level of activity was housework.  Pain was not related to eating or positional.  Her cardiac risk factors were age, smoking x50 years and hypertension.  Stress test April 2021 showed mild to moderate ischemia.  Chest pain had been increasing in frequency with lower levels of exertion.  She was started on aspirin 81 mg and Imdur 15 mg along with sublingual nitroglycerin as needed.  Her cardiac catheterization was scheduled.  Cardiac catheterization performed on 06/15/2019 showed minimal nonobstructive CAD.  Recommendations were to stop smoking and continue preventative therapy.  See cath report below.  She is here for follow-up today status post cardiac catheterization.  States she has been doing well from a cardiac standpoint.  She denies any recent progressive anginal or exertional symptoms.  She states she believes the Imdur has helped her tremendously with the chest pain she was having.  She is attempting to stop smoking and is currently taking Chantix.  She denies any other complaints other than some left leg pain.  She does not describe claudication-like symptoms but states her at night when she attempts to lie down to sleep her left leg starts to hurt.  States she had an injury to her left shin a while back but is not  certain this is contributing to her pain.  I reviewed her cardiac catheterization with her.  Past Medical History:  Diagnosis Date  . ADHD   . COPD (chronic obstructive pulmonary disease) ([place])   . Depression   . Hypertension   . Osteoporosis   . PVD (peripheral vascular disease) ([place])   . Tobacco abuse    Past Surgical History:  Procedure Laterality Date  . APPENDECTOMY  1975  . CHOLECYSTECTOMY  2007  . LEFT HEART CATH AND CORONARY ANGIOGRAPHY N/A 06/15/2019   Procedure: LEFT HEART CATH AND CORONARY ANGIOGRAPHY;  Surgeon: Jettie Booze, MD;  Location: [facility];  Service: Cardiovascular;  Laterality: N/A;  . NECK SURGERY  2010  . OVARY SURGERY    . TOTAL ABDOMINAL HYSTERECTOMY  1975  . VENTRAL HERNIA REPAIR  1985  . WRIST SURGERY Left 09/19/2018    Current Outpatient Medications  Medication Sig Dispense Refill  . acetaminophen (TYLENOL) 500 MG tablet Take 1,000 mg by mouth every 6 (six) hours as needed (for pain/headaches.).    Marland Kitchen albuterol (VENTOLIN HFA) 108 (90 Base) MCG/ACT inhaler Inhale 1-2 puffs into the lungs every 6 (six) hours as needed for wheezing or shortness of breath.    Marland Kitchen alendronate (FOSAMAX) 70 MG tablet Take 70 mg by mouth every Monday. Take with a full glass of water on an empty stomach.     Marland Kitchen aspirin EC 81 MG tablet Take 81 mg by mouth every evening.     Marland Kitchen buPROPion (WELLBUTRIN SR) 150 MG 12  hr tablet Take 150 mg by mouth 2 (two) times daily.    . calcium carbonate (CALTRATE 600) 1500 (600 Ca) MG TABS tablet Take 1 tablet by mouth 2 (two) times daily with a meal.     . cetirizine (ZYRTEC) 10 MG tablet Take 10 mg by mouth at bedtime.     . CHANTIX STARTING MONTH PAK 0.5 MG X 11 & 1 MG X 42 tablet Take 0.5-1 mg by mouth 2 (two) times daily.    . DULoxetine (CYMBALTA) 60 MG capsule Take 60 mg by mouth 2 (two) times daily.    Marland Kitchen gabapentin (NEURONTIN) 300 MG capsule Take 300 mg by mouth 2 (two) times daily.    . isosorbide mononitrate (IMDUR) 30 MG  24 hr tablet Take 0.5 tablets (15 mg total) by mouth daily. 90 tablet 1  . losartan-hydrochlorothiazide (HYZAAR) 100-12.5 MG tablet Take 1 tablet by mouth daily.    . nitroGLYCERIN (NITROSTAT) 0.4 MG SL tablet Place 1 tablet (0.4 mg total) under the tongue every 5 (five) minutes as needed for chest pain. 25 tablet 3  . umeclidinium-vilanterol (ANORO ELLIPTA) 62.5-25 MCG/INH AEPB Inhale 1 puff into the lungs daily.     Current Facility-Administered Medications  Medication Dose Route Frequency Provider Last Rate Last Admin  . sodium chloride flush (NS) 0.9 % injection 3 mL  3 mL Intravenous Q12H Branch, Alphonse Guild, MD       Allergies:  Ace inhibitors, Sulfa antibiotics, and Penicillins   Social History: The patient  reports that she has been smoking cigarettes. She has been smoking about 1.50 packs per day. She has never used smokeless tobacco.   Family History: The patient's family history includes Heart attack (age of onset: 43) in her sister; Heart attack (age of onset: 73) in her father; Hypertension in her mother.   ROS:  Please see the history of present illness. Otherwise, complete review of systems is positive for none.  All other systems are reviewed and negative.   Physical Exam: VS:  BP 118/80   Pulse 68   Ht 5' 3.5" (1.613 m)   Wt 149 lb (67.6 kg)   SpO2 98%   BMI 25.98 kg/m , BMI Body mass index is 25.98 kg/m.  Wt Readings from Last 3 Encounters:  07/12/19 149 lb (67.6 kg)  06/15/19 150 lb (68 kg)  06/08/19 151 lb (68.5 kg)    General: Patient appears comfortable at rest. HEENT: Conjunctiva and lids normal, oropharynx clear with moist mucosa. Neck: Supple, no elevated JVP or carotid bruits, no thyromegaly. Lungs: Clear to auscultation, nonlabored breathing at rest. Cardiac: Regular rate and rhythm, no S3 or significant systolic murmur, no pericardial rub. Abdomen: Soft, nontender, no hepatomegaly, bowel sounds present, no guarding or rebound. Extremities: No pitting  edema, distal pulses 2+. Skin: Warm and dry. Musculoskeletal: No kyphosis. Neuropsychiatric: Alert and oriented x3, affect grossly appropriate.  ECG:    Recent Labwork: 06/11/2019: BUN 18; Creatinine, Ser 0.63; Hemoglobin 14.4; Platelets 253; Potassium 4.1; Sodium 136  No results found for: CHOL, TRIG, HDL, CHOLHDL, VLDL, LDLCALC, LDLDIRECT  Other Studies Reviewed Today:  Cardiac catheterization 06/15/2019  Prox RCA lesion is 25% stenosed.  Prox Cx to Mid Cx lesion is 25% stenosed.  Mid LAD lesion is 10% stenosed.  The left ventricular systolic function is normal.  LV end diastolic pressure is normal.  The left ventricular ejection fraction is 55-65% by visual estimate.  There is no aortic valve stenosis.   Minimal, nonobstructive CAD.  Continue preventive therapy.  I recommended that she stop smoking. Diagnostic Dominance: Right   05/2019 nuclear stress  There was no ST segment deviation noted during stress.  Findings consistent with mild to moderate anterior ischemia.  The left ventricular ejection fraction is normal (55-65%).  Low to intermediate risk study   Assessment and Plan:  1. Other chest pain   2. CAD in native artery   3. Smoker   4. Essential hypertension    1. Other chest pain Patient currently denies any chest pain since started on Imdur.  She takes Imdur 15 mg daily.  2. CAD in native artery Recent cardiac catheterization showed nonobstructive disease.She had proximal RCA lesion 25%, proximal circumflex to mid circumflex 25%, mid LAD lesion 10%, LVEF 55 to 65%.  Continue aspirin 81 mg, Imdur 15 mg, sublingual nitroglycerin as needed.  3. Smoker Patient is currently taking Chantix to help assist her stop smoking.  We discussed the implications of continuing to smoke.  Patient is aware and verbalizes understanding.  4.  Hypertension Blood pressure is well controlled on current medication.  Continue losartan HCTZ 100/25 mg daily.  Medication  Adjustments/Labs and Tests Ordered: Current medicines are reviewed at length with the patient today.  Concerns regarding medicines are outlined above.   Disposition: Follow-up with Dr. Harl Bowie or APP 6 months Signed, Levell July, NP 07/12/2019 10:55 AM    [facility] [place], [place],  [address] Phone: [phone]; Fax: [phone]

## 2019-07-12 ENCOUNTER — Other Ambulatory Visit: Payer: Self-pay

## 2019-07-12 ENCOUNTER — Encounter: Payer: Self-pay | Admitting: Family Medicine

## 2019-07-12 ENCOUNTER — Ambulatory Visit (INDEPENDENT_AMBULATORY_CARE_PROVIDER_SITE_OTHER): Payer: Medicare Other | Admitting: Family Medicine

## 2019-07-12 VITALS — BP 118/80 | HR 68 | Ht 63.5 in | Wt 149.0 lb

## 2019-07-12 DIAGNOSIS — I2510 Atherosclerotic heart disease of native coronary artery without angina pectoris: Secondary | ICD-10-CM

## 2019-07-12 DIAGNOSIS — R0789 Other chest pain: Secondary | ICD-10-CM | POA: Diagnosis not present

## 2019-07-12 DIAGNOSIS — F17200 Nicotine dependence, unspecified, uncomplicated: Secondary | ICD-10-CM | POA: Diagnosis not present

## 2019-07-12 DIAGNOSIS — I10 Essential (primary) hypertension: Secondary | ICD-10-CM

## 2019-07-12 NOTE — Patient Instructions (Addendum)
Medication Instructions:    Your physician recommends that you continue on your current medications as directed. Please refer to the Current Medication list given to you today.  Labwork:  NONE  Testing/Procedures:  NONE  Follow-Up:  Your physician recommends that you schedule a follow-up appointment in: 6 months (office)  Any Other Special Instructions Will Be Listed Below (If Applicable).  If you need a refill on your cardiac medications before your next appointment, please call your pharmacy. 

## 2019-07-23 DIAGNOSIS — J441 Chronic obstructive pulmonary disease with (acute) exacerbation: Secondary | ICD-10-CM | POA: Diagnosis not present

## 2019-07-23 DIAGNOSIS — E7849 Other hyperlipidemia: Secondary | ICD-10-CM | POA: Diagnosis not present

## 2019-07-23 DIAGNOSIS — I700 Atherosclerosis of aorta: Secondary | ICD-10-CM | POA: Diagnosis not present

## 2019-07-23 DIAGNOSIS — I10 Essential (primary) hypertension: Secondary | ICD-10-CM | POA: Diagnosis not present

## 2019-07-23 DIAGNOSIS — Z720 Tobacco use: Secondary | ICD-10-CM | POA: Diagnosis not present

## 2019-10-01 DIAGNOSIS — I10 Essential (primary) hypertension: Secondary | ICD-10-CM | POA: Diagnosis not present

## 2019-10-01 DIAGNOSIS — J441 Chronic obstructive pulmonary disease with (acute) exacerbation: Secondary | ICD-10-CM | POA: Diagnosis not present

## 2019-10-01 DIAGNOSIS — J440 Chronic obstructive pulmonary disease with acute lower respiratory infection: Secondary | ICD-10-CM | POA: Diagnosis not present

## 2019-10-11 DIAGNOSIS — J440 Chronic obstructive pulmonary disease with acute lower respiratory infection: Secondary | ICD-10-CM | POA: Diagnosis not present

## 2019-10-11 DIAGNOSIS — J019 Acute sinusitis, unspecified: Secondary | ICD-10-CM | POA: Diagnosis not present

## 2019-10-23 DIAGNOSIS — J441 Chronic obstructive pulmonary disease with (acute) exacerbation: Secondary | ICD-10-CM | POA: Diagnosis not present

## 2019-10-23 DIAGNOSIS — E7849 Other hyperlipidemia: Secondary | ICD-10-CM | POA: Diagnosis not present

## 2019-10-23 DIAGNOSIS — Z720 Tobacco use: Secondary | ICD-10-CM | POA: Diagnosis not present

## 2019-10-23 DIAGNOSIS — I10 Essential (primary) hypertension: Secondary | ICD-10-CM | POA: Diagnosis not present

## 2019-11-20 DIAGNOSIS — J329 Chronic sinusitis, unspecified: Secondary | ICD-10-CM | POA: Diagnosis not present

## 2019-11-20 DIAGNOSIS — J441 Chronic obstructive pulmonary disease with (acute) exacerbation: Secondary | ICD-10-CM | POA: Diagnosis not present

## 2019-11-20 DIAGNOSIS — R509 Fever, unspecified: Secondary | ICD-10-CM | POA: Diagnosis not present

## 2020-01-11 ENCOUNTER — Encounter: Payer: Self-pay | Admitting: Cardiology

## 2020-01-11 DIAGNOSIS — Z1159 Encounter for screening for other viral diseases: Secondary | ICD-10-CM | POA: Diagnosis not present

## 2020-01-11 DIAGNOSIS — F17210 Nicotine dependence, cigarettes, uncomplicated: Secondary | ICD-10-CM | POA: Diagnosis not present

## 2020-01-11 DIAGNOSIS — I10 Essential (primary) hypertension: Secondary | ICD-10-CM | POA: Diagnosis not present

## 2020-01-11 DIAGNOSIS — J449 Chronic obstructive pulmonary disease, unspecified: Secondary | ICD-10-CM | POA: Diagnosis not present

## 2020-01-11 DIAGNOSIS — E782 Mixed hyperlipidemia: Secondary | ICD-10-CM | POA: Diagnosis not present

## 2020-01-16 DIAGNOSIS — I700 Atherosclerosis of aorta: Secondary | ICD-10-CM | POA: Diagnosis not present

## 2020-01-16 DIAGNOSIS — I25119 Atherosclerotic heart disease of native coronary artery with unspecified angina pectoris: Secondary | ICD-10-CM | POA: Diagnosis not present

## 2020-01-16 DIAGNOSIS — Z1389 Encounter for screening for other disorder: Secondary | ICD-10-CM | POA: Diagnosis not present

## 2020-01-16 DIAGNOSIS — J440 Chronic obstructive pulmonary disease with acute lower respiratory infection: Secondary | ICD-10-CM | POA: Diagnosis not present

## 2020-01-16 DIAGNOSIS — Z0001 Encounter for general adult medical examination with abnormal findings: Secondary | ICD-10-CM | POA: Diagnosis not present

## 2020-01-16 DIAGNOSIS — Z23 Encounter for immunization: Secondary | ICD-10-CM | POA: Diagnosis not present

## 2020-01-16 DIAGNOSIS — I10 Essential (primary) hypertension: Secondary | ICD-10-CM | POA: Diagnosis not present

## 2020-01-21 ENCOUNTER — Other Ambulatory Visit: Payer: Self-pay

## 2020-01-21 ENCOUNTER — Ambulatory Visit (INDEPENDENT_AMBULATORY_CARE_PROVIDER_SITE_OTHER): Payer: Medicare Other | Admitting: Cardiology

## 2020-01-21 ENCOUNTER — Encounter: Payer: Self-pay | Admitting: Cardiology

## 2020-01-21 ENCOUNTER — Encounter: Payer: Self-pay | Admitting: *Deleted

## 2020-01-21 VITALS — BP 122/92 | HR 82 | Ht 64.0 in | Wt 148.0 lb

## 2020-01-21 DIAGNOSIS — I2510 Atherosclerotic heart disease of native coronary artery without angina pectoris: Secondary | ICD-10-CM | POA: Diagnosis not present

## 2020-01-21 NOTE — Progress Notes (Signed)
Clinical Summary Ms. Danish is a 69 y.o.female seen today for follow up of the following medical problems.   1. Coronary Atherosclerosis - 04/2019 CT chest incidental findings showed aortic atherosclerosis and 3 vessel CAD. - 05/2019 nuclear stress mild to moderate ischemia - after stress test and with ongoing chest pains was referred for cath.   05/2019 cath mild CAD. - at last visit symptoms had improved on imdur - no recent chest pains.   2. Tobacco abuse - has tried chantix, nicotine patches did not work.   Past Medical History:  Diagnosis Date  . ADHD   . COPD (chronic obstructive pulmonary disease) ([place])   . Depression   . Hypertension   . Osteoporosis   . PVD (peripheral vascular disease) ([place])   . Tobacco abuse      Allergies  Allergen Reactions  . Ace Inhibitors Cough  . Sulfa Antibiotics Hives  . Penicillins Rash     Current Outpatient Medications  Medication Sig Dispense Refill  . acetaminophen (TYLENOL) 500 MG tablet Take 1,000 mg by mouth every 6 (six) hours as needed (for pain/headaches.).    Marland Kitchen albuterol (VENTOLIN HFA) 108 (90 Base) MCG/ACT inhaler Inhale 1-2 puffs into the lungs every 6 (six) hours as needed for wheezing or shortness of breath.    Marland Kitchen alendronate (FOSAMAX) 70 MG tablet Take 70 mg by mouth every Monday. Take with a full glass of water on an empty stomach.     Marland Kitchen aspirin EC 81 MG tablet Take 81 mg by mouth every evening.     Marland Kitchen buPROPion (WELLBUTRIN SR) 150 MG 12 hr tablet Take 150 mg by mouth 2 (two) times daily.    . calcium carbonate (CALTRATE 600) 1500 (600 Ca) MG TABS tablet Take 1 tablet by mouth 2 (two) times daily with a meal.     . cetirizine (ZYRTEC) 10 MG tablet Take 10 mg by mouth at bedtime.     . CHANTIX STARTING MONTH PAK 0.5 MG X 11 & 1 MG X 42 tablet Take 0.5-1 mg by mouth 2 (two) times daily.    . DULoxetine (CYMBALTA) 60 MG capsule Take 60 mg by mouth 2 (two) times daily.    Marland Kitchen gabapentin (NEURONTIN) 300 MG capsule Take  300 mg by mouth 2 (two) times daily.    . isosorbide mononitrate (IMDUR) 30 MG 24 hr tablet Take 0.5 tablets (15 mg total) by mouth daily. 90 tablet 1  . losartan-hydrochlorothiazide (HYZAAR) 100-12.5 MG tablet Take 1 tablet by mouth daily.    . nitroGLYCERIN (NITROSTAT) 0.4 MG SL tablet Place 1 tablet (0.4 mg total) under the tongue every 5 (five) minutes as needed for chest pain. 25 tablet 3  . umeclidinium-vilanterol (ANORO ELLIPTA) 62.5-25 MCG/INH AEPB Inhale 1 puff into the lungs daily.     Current Facility-Administered Medications  Medication Dose Route Frequency Provider Last Rate Last Admin  . sodium chloride flush (NS) 0.9 % injection 3 mL  3 mL Intravenous Q12H Cailynn Bodnar, Alphonse Guild, MD         Past Surgical History:  Procedure Laterality Date  . APPENDECTOMY  1975  . CHOLECYSTECTOMY  2007  . LEFT HEART CATH AND CORONARY ANGIOGRAPHY N/A 06/15/2019   Procedure: LEFT HEART CATH AND CORONARY ANGIOGRAPHY;  Surgeon: Jettie Booze, MD;  Location: [facility];  Service: Cardiovascular;  Laterality: N/A;  . NECK SURGERY  2010  . OVARY SURGERY    . TOTAL ABDOMINAL HYSTERECTOMY  1975  .  VENTRAL HERNIA REPAIR  1985  . WRIST SURGERY Left 09/19/2018     Allergies  Allergen Reactions  . Ace Inhibitors Cough  . Sulfa Antibiotics Hives  . Penicillins Rash      Family History  Problem Relation Age of Onset  . Heart attack Father 15  . Heart attack Sister 71  . Hypertension Mother      Social History Ms. Coon reports that she has been smoking cigarettes. She has been smoking about 1.50 packs per day. She has never used smokeless tobacco. Ms. Kanner has no history on file for alcohol use.   Review of Systems CONSTITUTIONAL: No weight loss, fever, chills, weakness or fatigue.  HEENT: Eyes: No visual loss, blurred vision, double vision or yellow sclerae.No hearing loss, sneezing, congestion, runny nose or sore throat.  SKIN: No rash or itching.  CARDIOVASCULAR:  per hpi RESPIRATORY: No shortness of breath, cough or sputum.  GASTROINTESTINAL: No anorexia, nausea, vomiting or diarrhea. No abdominal pain or blood.  GENITOURINARY: No burning on urination, no polyuria NEUROLOGICAL: No headache, dizziness, syncope, paralysis, ataxia, numbness or tingling in the extremities. No change in bowel or bladder control.  MUSCULOSKELETAL: No muscle, back pain, joint pain or stiffness.  LYMPHATICS: No enlarged nodes. No history of splenectomy.  PSYCHIATRIC: No history of depression or anxiety.  ENDOCRINOLOGIC: No reports of sweating, cold or heat intolerance. No polyuria or polydipsia.  Marland Kitchen   Physical Examination Today's Vitals   01/21/20 0841  BP: (!) 122/92  Pulse: 82  Weight: 148 lb (67.1 kg)  Height: 5\' 4"  (1.626 m)   Body mass index is 25.4 kg/m.  Gen: resting comfortably, no acute distress HEENT: no scleral icterus, pupils equal round and reactive, no palptable cervical adenopathy,  CV: RRR, no m/r/g, no jvd Resp: Clear to auscultation bilaterally GI: abdomen is soft, non-tender, non-distended, normal bowel sounds, no hepatosplenomegaly MSK: extremities are warm, no edema.  Skin: warm, no rash Neuro:  no focal deficits Psych: appropriate affect   Diagnostic Studies 05/2019 nuclear stress  There was no ST segment deviation noted during stress.  Findings consistent with mild to moderate anterior ischemia.  The left ventricular ejection fraction is normal (55-65%).  Low to intermediate risk study   05/2019 nuclear stress  Prox RCA lesion is 25% stenosed.  Prox Cx to Mid Cx lesion is 25% stenosed.  Mid LAD lesion is 10% stenosed.  The left ventricular systolic function is normal.  LV end diastolic pressure is normal.  The left ventricular ejection fraction is 55-65% by visual estimate.  There is no aortic valve stenosis.   Minimal, nonobstructive CAD.  Continue preventive therapy.  I recommended that she stop smoking.     Assessment and Plan  1. Chest pain/CAD - incidental findings of 3 vessel CAD by recent CT chest - nuclear stress with mild to moderate anterior ischemia -cath showed just mild CAD - no recent symptoms, has done well on imdur - continue medical therapy, f/u pcp labs, likely would start statin.    2. HTN - essentailyl at goal, conitnue current meds   Arnoldo Lenis, M.D.

## 2020-01-21 NOTE — Patient Instructions (Signed)

## 2020-01-22 DIAGNOSIS — E7849 Other hyperlipidemia: Secondary | ICD-10-CM | POA: Diagnosis not present

## 2020-01-22 DIAGNOSIS — I10 Essential (primary) hypertension: Secondary | ICD-10-CM | POA: Diagnosis not present

## 2020-01-22 DIAGNOSIS — Z720 Tobacco use: Secondary | ICD-10-CM | POA: Diagnosis not present

## 2020-01-22 DIAGNOSIS — J441 Chronic obstructive pulmonary disease with (acute) exacerbation: Secondary | ICD-10-CM | POA: Diagnosis not present

## 2020-02-01 DIAGNOSIS — I739 Peripheral vascular disease, unspecified: Secondary | ICD-10-CM | POA: Diagnosis not present

## 2020-02-26 DIAGNOSIS — I739 Peripheral vascular disease, unspecified: Secondary | ICD-10-CM | POA: Diagnosis not present

## 2020-02-26 DIAGNOSIS — I10 Essential (primary) hypertension: Secondary | ICD-10-CM | POA: Diagnosis not present

## 2020-02-26 DIAGNOSIS — I25119 Atherosclerotic heart disease of native coronary artery with unspecified angina pectoris: Secondary | ICD-10-CM | POA: Diagnosis not present

## 2020-02-26 DIAGNOSIS — J440 Chronic obstructive pulmonary disease with acute lower respiratory infection: Secondary | ICD-10-CM | POA: Diagnosis not present

## 2020-02-26 DIAGNOSIS — I700 Atherosclerosis of aorta: Secondary | ICD-10-CM | POA: Diagnosis not present

## 2020-03-04 DIAGNOSIS — M545 Low back pain, unspecified: Secondary | ICD-10-CM | POA: Diagnosis not present

## 2020-03-04 DIAGNOSIS — M2569 Stiffness of other specified joint, not elsewhere classified: Secondary | ICD-10-CM | POA: Diagnosis not present

## 2020-03-06 DIAGNOSIS — F17210 Nicotine dependence, cigarettes, uncomplicated: Secondary | ICD-10-CM | POA: Diagnosis not present

## 2020-03-07 DIAGNOSIS — M2569 Stiffness of other specified joint, not elsewhere classified: Secondary | ICD-10-CM | POA: Diagnosis not present

## 2020-03-07 DIAGNOSIS — M545 Low back pain, unspecified: Secondary | ICD-10-CM | POA: Diagnosis not present

## 2020-03-18 DIAGNOSIS — M2569 Stiffness of other specified joint, not elsewhere classified: Secondary | ICD-10-CM | POA: Diagnosis not present

## 2020-03-18 DIAGNOSIS — M545 Low back pain, unspecified: Secondary | ICD-10-CM | POA: Diagnosis not present

## 2020-03-20 DIAGNOSIS — M545 Low back pain, unspecified: Secondary | ICD-10-CM | POA: Diagnosis not present

## 2020-03-20 DIAGNOSIS — M2569 Stiffness of other specified joint, not elsewhere classified: Secondary | ICD-10-CM | POA: Diagnosis not present

## 2020-03-25 DIAGNOSIS — M545 Low back pain, unspecified: Secondary | ICD-10-CM | POA: Diagnosis not present

## 2020-03-25 DIAGNOSIS — M2569 Stiffness of other specified joint, not elsewhere classified: Secondary | ICD-10-CM | POA: Diagnosis not present

## 2020-03-27 DIAGNOSIS — M2569 Stiffness of other specified joint, not elsewhere classified: Secondary | ICD-10-CM | POA: Diagnosis not present

## 2020-03-27 DIAGNOSIS — M545 Low back pain, unspecified: Secondary | ICD-10-CM | POA: Diagnosis not present

## 2020-03-31 DIAGNOSIS — Z1231 Encounter for screening mammogram for malignant neoplasm of breast: Secondary | ICD-10-CM | POA: Diagnosis not present

## 2020-04-03 DIAGNOSIS — R2689 Other abnormalities of gait and mobility: Secondary | ICD-10-CM | POA: Diagnosis not present

## 2020-04-03 DIAGNOSIS — M545 Low back pain, unspecified: Secondary | ICD-10-CM | POA: Diagnosis not present

## 2020-04-03 DIAGNOSIS — M2569 Stiffness of other specified joint, not elsewhere classified: Secondary | ICD-10-CM | POA: Diagnosis not present

## 2020-04-03 DIAGNOSIS — M6281 Muscle weakness (generalized): Secondary | ICD-10-CM | POA: Diagnosis not present

## 2020-04-08 DIAGNOSIS — M545 Low back pain, unspecified: Secondary | ICD-10-CM | POA: Diagnosis not present

## 2020-04-08 DIAGNOSIS — M6281 Muscle weakness (generalized): Secondary | ICD-10-CM | POA: Diagnosis not present

## 2020-04-08 DIAGNOSIS — R2689 Other abnormalities of gait and mobility: Secondary | ICD-10-CM | POA: Diagnosis not present

## 2020-04-08 DIAGNOSIS — M2569 Stiffness of other specified joint, not elsewhere classified: Secondary | ICD-10-CM | POA: Diagnosis not present

## 2020-05-13 DIAGNOSIS — J440 Chronic obstructive pulmonary disease with acute lower respiratory infection: Secondary | ICD-10-CM | POA: Diagnosis not present

## 2020-05-13 DIAGNOSIS — E782 Mixed hyperlipidemia: Secondary | ICD-10-CM | POA: Diagnosis not present

## 2020-05-13 DIAGNOSIS — I10 Essential (primary) hypertension: Secondary | ICD-10-CM | POA: Diagnosis not present

## 2020-05-13 DIAGNOSIS — E7849 Other hyperlipidemia: Secondary | ICD-10-CM | POA: Diagnosis not present

## 2020-05-13 DIAGNOSIS — Z131 Encounter for screening for diabetes mellitus: Secondary | ICD-10-CM | POA: Diagnosis not present

## 2020-05-13 DIAGNOSIS — R946 Abnormal results of thyroid function studies: Secondary | ICD-10-CM | POA: Diagnosis not present

## 2020-05-13 DIAGNOSIS — E1165 Type 2 diabetes mellitus with hyperglycemia: Secondary | ICD-10-CM | POA: Diagnosis not present

## 2020-05-15 DIAGNOSIS — J449 Chronic obstructive pulmonary disease, unspecified: Secondary | ICD-10-CM | POA: Diagnosis not present

## 2020-05-15 DIAGNOSIS — J329 Chronic sinusitis, unspecified: Secondary | ICD-10-CM | POA: Diagnosis not present

## 2020-05-15 DIAGNOSIS — F17210 Nicotine dependence, cigarettes, uncomplicated: Secondary | ICD-10-CM | POA: Diagnosis not present

## 2020-05-15 DIAGNOSIS — I700 Atherosclerosis of aorta: Secondary | ICD-10-CM | POA: Diagnosis not present

## 2020-05-15 DIAGNOSIS — J440 Chronic obstructive pulmonary disease with acute lower respiratory infection: Secondary | ICD-10-CM | POA: Diagnosis not present

## 2020-05-15 DIAGNOSIS — I10 Essential (primary) hypertension: Secondary | ICD-10-CM | POA: Diagnosis not present

## 2020-05-15 DIAGNOSIS — I25119 Atherosclerotic heart disease of native coronary artery with unspecified angina pectoris: Secondary | ICD-10-CM | POA: Diagnosis not present

## 2020-06-17 DIAGNOSIS — J441 Chronic obstructive pulmonary disease with (acute) exacerbation: Secondary | ICD-10-CM | POA: Diagnosis not present

## 2020-06-21 DIAGNOSIS — J441 Chronic obstructive pulmonary disease with (acute) exacerbation: Secondary | ICD-10-CM | POA: Diagnosis not present

## 2020-06-21 DIAGNOSIS — E7849 Other hyperlipidemia: Secondary | ICD-10-CM | POA: Diagnosis not present

## 2020-06-21 DIAGNOSIS — I700 Atherosclerosis of aorta: Secondary | ICD-10-CM | POA: Diagnosis not present

## 2020-06-21 DIAGNOSIS — Z720 Tobacco use: Secondary | ICD-10-CM | POA: Diagnosis not present

## 2020-06-21 DIAGNOSIS — I10 Essential (primary) hypertension: Secondary | ICD-10-CM | POA: Diagnosis not present

## 2020-08-04 ENCOUNTER — Ambulatory Visit: Payer: Medicare Other | Admitting: Cardiology

## 2020-08-04 ENCOUNTER — Encounter: Payer: Self-pay | Admitting: Cardiology

## 2020-08-04 NOTE — Progress Notes (Deleted)
Clinical Summary Paige Zimmerman is a 70 y.o.female seen today for follow up of the following medical problems.    1. Coronary  Atherosclerosis - 04/2019 CT chest incidental findings showed aortic atherosclerosis and 3 vessel CAD.  - 05/2019 nuclear stress mild to moderate ischemia - after stress test and with ongoing chest pains was referred for cath.    05/2019 cath mild CAD. - at last visit symptoms had improved on imdur - no recent chest pains.   2. Tobacco abuse - has tried chantix, nicotine patches did not work.    Past Medical History:  Diagnosis Date   ADHD    COPD (chronic obstructive pulmonary disease) (HCC)    Depression    Hypertension    Osteoporosis    PVD (peripheral vascular disease) (HCC)    Tobacco abuse      Allergies  Allergen Reactions   Ace Inhibitors Cough   Sulfa Antibiotics Hives   Penicillins Rash     Current Outpatient Medications  Medication Sig Dispense Refill   acetaminophen (TYLENOL) 500 MG tablet Take 1,000 mg by mouth every 6 (six) hours as needed (for pain/headaches.).     albuterol (VENTOLIN HFA) 108 (90 Base) MCG/ACT inhaler Inhale 1-2 puffs into the lungs every 6 (six) hours as needed for wheezing or shortness of breath.     alendronate (FOSAMAX) 70 MG tablet Take 70 mg by mouth every Monday. Take with a full glass of water on an empty stomach.      aspirin EC 81 MG tablet Take 81 mg by mouth every evening.      buPROPion (WELLBUTRIN SR) 150 MG 12 hr tablet Take 150 mg by mouth 2 (two) times daily.     calcium carbonate (CALTRATE 600) 1500 (600 Ca) MG TABS tablet Take 1 tablet by mouth 2 (two) times daily with a meal.      cetirizine (ZYRTEC) 10 MG tablet Take 10 mg by mouth at bedtime.      DULoxetine (CYMBALTA) 60 MG capsule Take 60 mg by mouth 2 (two) times daily.     gabapentin (NEURONTIN) 300 MG capsule Take 300 mg by mouth 2 (two) times daily.     isosorbide mononitrate (IMDUR) 30 MG 24 hr tablet Take 0.5 tablets (15 mg total)  by mouth daily. 90 tablet 1   losartan-hydrochlorothiazide (HYZAAR) 100-12.5 MG tablet Take 1 tablet by mouth daily.     nitroGLYCERIN (NITROSTAT) 0.4 MG SL tablet Place 1 tablet (0.4 mg total) under the tongue every 5 (five) minutes as needed for chest pain. 25 tablet 3   umeclidinium-vilanterol (ANORO ELLIPTA) 62.5-25 MCG/INH AEPB Inhale 1 puff into the lungs daily.     Current Facility-Administered Medications  Medication Dose Route Frequency Provider Last Rate Last Admin   sodium chloride flush (NS) 0.9 % injection 3 mL  3 mL Intravenous Q12H Arnoldo Lenis, MD         Past Surgical History:  Procedure Laterality Date   APPENDECTOMY  1975   CHOLECYSTECTOMY  2007   LEFT HEART CATH AND CORONARY ANGIOGRAPHY N/A 06/15/2019   Procedure: LEFT HEART CATH AND CORONARY ANGIOGRAPHY;  Surgeon: Jettie Booze, MD;  Location: [facility];  Service: Cardiovascular;  Laterality: N/A;   NECK SURGERY  2010   OVARY SURGERY     TOTAL ABDOMINAL HYSTERECTOMY  1975   VENTRAL HERNIA REPAIR  1985   WRIST SURGERY Left 09/19/2018     Allergies  Allergen Reactions  Ace Inhibitors Cough   Sulfa Antibiotics Hives   Penicillins Rash      Family History  Problem Relation Age of Onset   Heart attack Father 22   Heart attack Sister 83   Hypertension Mother      Social History Paige Zimmerman reports that she has been smoking cigarettes. She has been smoking an average of 1.50 packs per day. She has never used smokeless tobacco. Paige Zimmerman has no history on file for alcohol use.   Review of Systems CONSTITUTIONAL: No weight loss, fever, chills, weakness or fatigue.  HEENT: Eyes: No visual loss, blurred vision, double vision or yellow sclerae.No hearing loss, sneezing, congestion, runny nose or sore throat.  SKIN: No rash or itching.  CARDIOVASCULAR:  RESPIRATORY: No shortness of breath, cough or sputum.  GASTROINTESTINAL: No anorexia, nausea, vomiting or diarrhea. No abdominal pain  or blood.  GENITOURINARY: No burning on urination, no polyuria NEUROLOGICAL: No headache, dizziness, syncope, paralysis, ataxia, numbness or tingling in the extremities. No change in bowel or bladder control.  MUSCULOSKELETAL: No muscle, back pain, joint pain or stiffness.  LYMPHATICS: No enlarged nodes. No history of splenectomy.  PSYCHIATRIC: No history of depression or anxiety.  ENDOCRINOLOGIC: No reports of sweating, cold or heat intolerance. No polyuria or polydipsia.  Marland Kitchen   Physical Examination There were no vitals filed for this visit. There were no vitals filed for this visit.  Gen: resting comfortably, no acute distress HEENT: no scleral icterus, pupils equal round and reactive, no palptable cervical adenopathy,  CV Resp: Clear to auscultation bilaterally GI: abdomen is soft, non-tender, non-distended, normal bowel sounds, no hepatosplenomegaly MSK: extremities are warm, no edema.  Skin: warm, no rash Neuro:  no focal deficits Psych: appropriate affect   Diagnostic Studies 05/2019 nuclear stress There was no ST segment deviation noted during stress. Findings consistent with mild to moderate anterior ischemia. The left ventricular ejection fraction is normal (55-65%). Low to intermediate risk study    05/2019 nuclear stress Prox RCA lesion is 25% stenosed. Prox Cx to Mid Cx lesion is 25% stenosed. Mid LAD lesion is 10% stenosed. The left ventricular systolic function is normal. LV end diastolic pressure is normal. The left ventricular ejection fraction is 55-65% by visual estimate. There is no aortic valve stenosis.   Minimal, nonobstructive CAD.  Continue preventive therapy.  I recommended that she stop smoking.     Assessment and Plan  1. Chest pain/CAD - incidental findings of 3 vessel CAD by recent CT chest - nuclear stress with mild to moderate anterior ischemia -cath showed just mild CAD - no recent symptoms, has done well on imdur - continue medical  therapy, f/u pcp labs, likely would start statin.     2. HTN - essentailyl at goal, conitnue current meds      Arnoldo Lenis, M.D., F.A.C.C.

## 2020-08-21 DIAGNOSIS — E782 Mixed hyperlipidemia: Secondary | ICD-10-CM | POA: Diagnosis not present

## 2020-08-21 DIAGNOSIS — I700 Atherosclerosis of aorta: Secondary | ICD-10-CM | POA: Diagnosis not present

## 2020-08-21 DIAGNOSIS — I25119 Atherosclerotic heart disease of native coronary artery with unspecified angina pectoris: Secondary | ICD-10-CM | POA: Diagnosis not present

## 2020-08-21 DIAGNOSIS — I10 Essential (primary) hypertension: Secondary | ICD-10-CM | POA: Diagnosis not present

## 2020-08-21 DIAGNOSIS — J440 Chronic obstructive pulmonary disease with acute lower respiratory infection: Secondary | ICD-10-CM | POA: Diagnosis not present

## 2020-09-12 DIAGNOSIS — Z131 Encounter for screening for diabetes mellitus: Secondary | ICD-10-CM | POA: Diagnosis not present

## 2020-09-12 DIAGNOSIS — E782 Mixed hyperlipidemia: Secondary | ICD-10-CM | POA: Diagnosis not present

## 2020-09-12 DIAGNOSIS — R739 Hyperglycemia, unspecified: Secondary | ICD-10-CM | POA: Diagnosis not present

## 2020-09-12 DIAGNOSIS — I10 Essential (primary) hypertension: Secondary | ICD-10-CM | POA: Diagnosis not present

## 2020-09-12 DIAGNOSIS — E7849 Other hyperlipidemia: Secondary | ICD-10-CM | POA: Diagnosis not present

## 2020-09-12 DIAGNOSIS — R5382 Chronic fatigue, unspecified: Secondary | ICD-10-CM | POA: Diagnosis not present

## 2020-09-15 DIAGNOSIS — J329 Chronic sinusitis, unspecified: Secondary | ICD-10-CM | POA: Diagnosis not present

## 2020-09-15 DIAGNOSIS — I10 Essential (primary) hypertension: Secondary | ICD-10-CM | POA: Diagnosis not present

## 2020-09-15 DIAGNOSIS — Z7189 Other specified counseling: Secondary | ICD-10-CM | POA: Diagnosis not present

## 2020-09-15 DIAGNOSIS — F17210 Nicotine dependence, cigarettes, uncomplicated: Secondary | ICD-10-CM | POA: Diagnosis not present

## 2020-09-15 DIAGNOSIS — J440 Chronic obstructive pulmonary disease with acute lower respiratory infection: Secondary | ICD-10-CM | POA: Diagnosis not present

## 2020-09-15 DIAGNOSIS — I739 Peripheral vascular disease, unspecified: Secondary | ICD-10-CM | POA: Diagnosis not present

## 2020-09-15 DIAGNOSIS — I25119 Atherosclerotic heart disease of native coronary artery with unspecified angina pectoris: Secondary | ICD-10-CM | POA: Diagnosis not present

## 2020-10-22 DIAGNOSIS — E782 Mixed hyperlipidemia: Secondary | ICD-10-CM | POA: Diagnosis not present

## 2020-10-22 DIAGNOSIS — I25119 Atherosclerotic heart disease of native coronary artery with unspecified angina pectoris: Secondary | ICD-10-CM | POA: Diagnosis not present

## 2020-10-22 DIAGNOSIS — J440 Chronic obstructive pulmonary disease with acute lower respiratory infection: Secondary | ICD-10-CM | POA: Diagnosis not present

## 2020-10-22 DIAGNOSIS — I10 Essential (primary) hypertension: Secondary | ICD-10-CM | POA: Diagnosis not present

## 2020-10-22 DIAGNOSIS — I700 Atherosclerosis of aorta: Secondary | ICD-10-CM | POA: Diagnosis not present

## 2020-10-31 DIAGNOSIS — R0602 Shortness of breath: Secondary | ICD-10-CM | POA: Diagnosis not present

## 2020-10-31 DIAGNOSIS — J439 Emphysema, unspecified: Secondary | ICD-10-CM | POA: Diagnosis not present

## 2020-10-31 DIAGNOSIS — I10 Essential (primary) hypertension: Secondary | ICD-10-CM | POA: Diagnosis not present

## 2020-10-31 DIAGNOSIS — I700 Atherosclerosis of aorta: Secondary | ICD-10-CM | POA: Diagnosis not present

## 2020-10-31 DIAGNOSIS — I2510 Atherosclerotic heart disease of native coronary artery without angina pectoris: Secondary | ICD-10-CM | POA: Diagnosis not present

## 2020-10-31 DIAGNOSIS — Z720 Tobacco use: Secondary | ICD-10-CM | POA: Diagnosis not present

## 2020-10-31 DIAGNOSIS — F17200 Nicotine dependence, unspecified, uncomplicated: Secondary | ICD-10-CM | POA: Diagnosis not present

## 2020-10-31 DIAGNOSIS — U071 COVID-19: Secondary | ICD-10-CM | POA: Diagnosis not present

## 2020-10-31 DIAGNOSIS — J8410 Pulmonary fibrosis, unspecified: Secondary | ICD-10-CM | POA: Diagnosis not present

## 2021-02-09 DIAGNOSIS — J329 Chronic sinusitis, unspecified: Secondary | ICD-10-CM | POA: Diagnosis not present

## 2021-02-09 DIAGNOSIS — R059 Cough, unspecified: Secondary | ICD-10-CM | POA: Diagnosis not present

## 2021-02-09 DIAGNOSIS — F17210 Nicotine dependence, cigarettes, uncomplicated: Secondary | ICD-10-CM | POA: Diagnosis not present

## 2021-02-09 DIAGNOSIS — J441 Chronic obstructive pulmonary disease with (acute) exacerbation: Secondary | ICD-10-CM | POA: Diagnosis not present

## 2021-03-06 DIAGNOSIS — J449 Chronic obstructive pulmonary disease, unspecified: Secondary | ICD-10-CM | POA: Diagnosis not present

## 2021-03-06 DIAGNOSIS — E7849 Other hyperlipidemia: Secondary | ICD-10-CM | POA: Diagnosis not present

## 2021-03-06 DIAGNOSIS — I10 Essential (primary) hypertension: Secondary | ICD-10-CM | POA: Diagnosis not present

## 2021-03-06 DIAGNOSIS — Z1329 Encounter for screening for other suspected endocrine disorder: Secondary | ICD-10-CM | POA: Diagnosis not present

## 2021-03-06 DIAGNOSIS — E782 Mixed hyperlipidemia: Secondary | ICD-10-CM | POA: Diagnosis not present

## 2021-03-10 DIAGNOSIS — J449 Chronic obstructive pulmonary disease, unspecified: Secondary | ICD-10-CM | POA: Diagnosis not present

## 2021-03-10 DIAGNOSIS — I10 Essential (primary) hypertension: Secondary | ICD-10-CM | POA: Diagnosis not present

## 2021-03-10 DIAGNOSIS — Z23 Encounter for immunization: Secondary | ICD-10-CM | POA: Diagnosis not present

## 2021-03-10 DIAGNOSIS — Z1389 Encounter for screening for other disorder: Secondary | ICD-10-CM | POA: Diagnosis not present

## 2021-03-10 DIAGNOSIS — Z0001 Encounter for general adult medical examination with abnormal findings: Secondary | ICD-10-CM | POA: Diagnosis not present

## 2021-03-10 DIAGNOSIS — I25119 Atherosclerotic heart disease of native coronary artery with unspecified angina pectoris: Secondary | ICD-10-CM | POA: Diagnosis not present

## 2021-03-19 DIAGNOSIS — F17210 Nicotine dependence, cigarettes, uncomplicated: Secondary | ICD-10-CM | POA: Diagnosis not present

## 2021-03-19 DIAGNOSIS — I700 Atherosclerosis of aorta: Secondary | ICD-10-CM | POA: Diagnosis not present

## 2021-03-22 DIAGNOSIS — E782 Mixed hyperlipidemia: Secondary | ICD-10-CM | POA: Diagnosis not present

## 2021-03-22 DIAGNOSIS — I10 Essential (primary) hypertension: Secondary | ICD-10-CM | POA: Diagnosis not present

## 2021-03-29 ENCOUNTER — Encounter: Payer: Self-pay | Admitting: Student

## 2021-03-29 NOTE — Progress Notes (Signed)
Cardiology Office Note    Date:  04/01/2021   ID:  Gweneth, Fredlund 10/31/1950, MRN [id number]  PCP:  Caryl Bis, MD  Cardiologist: Carlyle Dolly, MD    Chief Complaint  Patient presents with   Follow-up    Recent tachycardia    History of Present Illness:    Paige Zimmerman is a 71 y.o. female with past medical history of CAD (s/p NST in 05/2019 showing ischemia with cath showing mild nonobstructive CAD), HTN, COPD and tobacco use who presents to the office today for overdue follow-up and recent tachycardia.  She was last examined by Dr. Harl Bowie in 12/2019 and denied any recent chest pain or palpitations at that time.  She was continued on her current cardiac medications including ASA 81 mg daily, Imdur 15 mg daily and Losartan-HCTZ 100-12.5 mg daily with consideration of statin therapy in the future pending repeat FLP.  In talking with the patient today, she reports noting an elevated heart rate for the past 2 to 3 weeks and says this occurred when she went from drinking 8 beers a day down to 0 beers per day and experienced associated withdrawal symptoms. She did restart drinking several days ago and feels like her heart rate has improved since.  Also reports consuming 3 to 4 cups of caffeinated tea each day. She denies any associated exertional chest pain. Does have baseline dyspnea on exertion in the setting of COPD but no acute change in this. No recent orthopnea, PND or pitting edema.   Past Medical History:  Diagnosis Date   ADHD    CAD (coronary artery disease)    a. 05/2019: cath showing minimal nonobstructive CAD with risk factor modification recommended.   COPD (chronic obstructive pulmonary disease) (HCC)    Depression    Hypertension    Osteoporosis    PVD (peripheral vascular disease) ([place])    Tobacco abuse     Past Surgical History:  Procedure Laterality Date   APPENDECTOMY  1975   CHOLECYSTECTOMY  2007   LEFT HEART CATH AND CORONARY ANGIOGRAPHY N/A  06/15/2019   Procedure: LEFT HEART CATH AND CORONARY ANGIOGRAPHY;  Surgeon: Jettie Booze, MD;  Location: [facility];  Service: Cardiovascular;  Laterality: N/A;   NECK SURGERY  2010   OVARY SURGERY     TOTAL ABDOMINAL HYSTERECTOMY  1975   VENTRAL HERNIA REPAIR  1985   WRIST SURGERY Left 09/19/2018    Current Medications: Outpatient Medications Prior to Visit  Medication Sig Dispense Refill   albuterol (VENTOLIN HFA) 108 (90 Base) MCG/ACT inhaler Inhale 1-2 puffs into the lungs every 6 (six) hours as needed for wheezing or shortness of breath.     buPROPion (WELLBUTRIN SR) 150 MG 12 hr tablet Take 150 mg by mouth 2 (two) times daily.     cetirizine (ZYRTEC) 10 MG tablet Take 10 mg by mouth at bedtime.      DULoxetine (CYMBALTA) 60 MG capsule Take 60 mg by mouth 2 (two) times daily.     gabapentin (NEURONTIN) 300 MG capsule Take 300 mg by mouth 2 (two) times daily.     isosorbide mononitrate (IMDUR) 30 MG 24 hr tablet Take 0.5 tablets (15 mg total) by mouth daily. 90 tablet 1   nitroGLYCERIN (NITROSTAT) 0.4 MG SL tablet Place 1 tablet (0.4 mg total) under the tongue every 5 (five) minutes as needed for chest pain. 25 tablet 3   rosuvastatin (CRESTOR) 5 MG tablet Take 5 mg  by mouth daily.     umeclidinium-vilanterol (ANORO ELLIPTA) 62.5-25 MCG/INH AEPB Inhale 1 puff into the lungs daily.     aspirin EC 81 MG tablet Take 81 mg by mouth every evening.      losartan-hydrochlorothiazide (HYZAAR) 100-12.5 MG tablet Take 1 tablet by mouth daily.     acetaminophen (TYLENOL) 500 MG tablet Take 1,000 mg by mouth every 6 (six) hours as needed (for pain/headaches.). (Patient not taking: Reported on 04/01/2021)     alendronate (FOSAMAX) 70 MG tablet Take 70 mg by mouth every Monday. Take with a full glass of water on an empty stomach.  (Patient not taking: Reported on 04/01/2021)     calcium carbonate (OSCAL) 1500 (600 Ca) MG TABS tablet Take 1 tablet by mouth 2 (two) times daily with a meal.   (Patient not taking: Reported on 04/01/2021)     Facility-Administered Medications Prior to Visit  Medication Dose Route Frequency Provider Last Rate Last Admin   sodium chloride flush (NS) 0.9 % injection 3 mL  3 mL Intravenous Q12H Branch, Alphonse Guild, MD         Allergies:   Ace inhibitors, Sulfa antibiotics, and Penicillins   Social History   Socioeconomic History   Marital status: Married    Spouse name: Not on file   Number of children: Not on file   Years of education: Not on file   Highest education level: Not on file  Occupational History   Not on file  Tobacco Use   Smoking status: Every Day    Packs/day: 1.50    Types: Cigarettes   Smokeless tobacco: Never  Vaping Use   Vaping Use: Never used  Substance and Sexual Activity   Alcohol use: Not on file   Drug use: Not on file   Sexual activity: Not on file  Other Topics Concern   Not on file  Social History Narrative   Not on file   Social Determinants of Health   Financial Resource Strain: Not on file  Food Insecurity: Not on file  Transportation Needs: Not on file  Physical Activity: Not on file  Stress: Not on file  Social Connections: Not on file     Family History:  The patient's family history includes Heart attack (age of onset: 75) in her sister; Heart attack (age of onset: 72) in her father; Hypertension in her mother.   Review of Systems:    Please see the history of present illness.     All other systems reviewed and are otherwise negative except as noted above.   Physical Exam:    VS:  BP 130/86    Pulse (!) 144    Ht 5\' 3"  (1.6 m)    Wt 143 lb 3.2 oz (65 kg)    SpO2 98%    BMI 25.37 kg/m    General: Well developed, well nourished,female appearing in no acute distress. Head: Normocephalic, atraumatic. Neck: No carotid bruits. JVD not elevated.  Lungs: Respirations regular and unlabored, without wheezes or rales.  Heart: Irregularly irregular, tachycardiac. No S3 or S4.  No murmur, no  rubs, or gallops appreciated. Abdomen: Appears non-distended. No obvious abdominal masses. Msk:  Strength and tone appear normal for age. No obvious joint deformities or effusions. Extremities: No clubbing or cyanosis. No pitting edema.  Distal pedal pulses are 2+ bilaterally. Neuro: Alert and oriented X 3. Moves all extremities spontaneously. No focal deficits noted. Psych:  Responds to questions appropriately with a normal affect. Skin:  No rashes or lesions noted  Wt Readings from Last 3 Encounters:  04/01/21 143 lb 3.2 oz (65 kg)  01/21/20 148 lb (67.1 kg)  07/12/19 149 lb (67.6 kg)     Studies/Labs Reviewed:   EKG:  EKG is ordered today.  The ekg ordered today demonstrates atrial flutter with RVR, heart rate 144.  Recent Labs: No results found for requested labs within last 8760 hours.   Lipid Panel No results found for: CHOL, TRIG, HDL, CHOLHDL, VLDL, LDLCALC, LDLDIRECT  Additional studies/ records that were reviewed today include:   ABI's: 01/2020 FINDINGS:  Right ABI:  1.16   Left ABI:  1.13   Right Lower Extremity:  Normal arterial waveforms at the ankle.   Left Lower Extremity:  Normal arterial waveforms at the ankle.   1.0-1.4 Normal   IMPRESSION:  Normal resting ankle-brachial indices bilaterally.   Cardiac Catheterization: 05/2019 Prox RCA lesion is 25% stenosed. Prox Cx to Mid Cx lesion is 25% stenosed. Mid LAD lesion is 10% stenosed. The left ventricular systolic function is normal. LV end diastolic pressure is normal. The left ventricular ejection fraction is 55-65% by visual estimate. There is no aortic valve stenosis.   Minimal, nonobstructive CAD.  Continue preventive therapy.  I recommended that she stop smoking.   Assessment:    1. Atrial flutter with rapid ventricular response ([place])   2. Coronary artery disease involving native coronary artery of native heart without angina pectoris   3. Essential hypertension   4. Hyperlipidemia LDL goal  <70   5. Alcohol use      Plan:   In order of problems listed above:  1. Atrial Flutter with RVR - Has likely been occurring for 3-4 weeks as her HR was at 140 during her visit with her PCP but an EKG was not performed. She does report palpitations throughout as well.  - Will plan to start Lopressor 50mg  BID. Will stop Losartan-HCTZ to allow room for titration of AV nodal blocking agents. Start Eliquis 5mg  BID which is the appropriate dose based off recent labs last month from her PCP. Reviewed with Dr. Domenic Polite (DOD) and will bring back for a repeat EKG early next week. If rates remain significantly elevated, would plan for a TEE/DCCV as she would be at risk for a tachycardia-mediated cardiomyopathy. Will send a message to Dr. Harl Bowie to review with him as well. Would plan for a follow-up echo once rates have improved. We also reviewed the importance of reducing her caffeine and alcohol use.   2. CAD - She had a false positive NST in 05/2019 with cath showing mild nonobstructive CAD. She denies any recent anginal symptoms.  - Would plan for a repeat echocardiogram once HR has improved.  - Continue Imdur and Crestor. Will add Lopressor as outlined above. Stop ASA given the need for anticoagulation.   3. HTN - Her BP is at 130/86 during today's visit. Given her tachycardiac, will stop Losartan-HCTZ for now and start Lopressor.   4. HLD - Followed by her PCP. She remains on Crestor 5mg  daily.   5. Alcohol Use - She was previously consuming 8-10 beers daily but has reduced her use. The importance of continued reduction was reviewed with the patient.    Medication Adjustments/Labs and Tests Ordered: Current medicines are reviewed at length with the patient today.  Concerns regarding medicines are outlined above.  Medication changes, Labs and Tests ordered today are listed in the Patient Instructions below. Patient Instructions  Medication Instructions:  STOP- Aspirin 81 mg tablets STOP-  Losartan/HCTZ tablets START- Eliquis 5 mg tablets twice daily START Lopressor 50 mg tablets twice daily  Follow-Up: Nurse Visit- Tuesday Follow up with Bernerd Pho, PA-C in 4-6 weeks   If you need a refill on your cardiac medications before your next appointment, please call your pharmacy.    Signed, Erma Heritage, PA-C  04/01/2021 8:27 PM    [facility] [address] [place], [place] [address] Phone: [phone] Fax: [phone]

## 2021-04-01 ENCOUNTER — Encounter: Payer: Self-pay | Admitting: Student

## 2021-04-01 ENCOUNTER — Other Ambulatory Visit: Payer: Self-pay

## 2021-04-01 ENCOUNTER — Ambulatory Visit: Payer: Medicare Other | Admitting: Student

## 2021-04-01 VITALS — BP 130/86 | HR 144 | Ht 63.0 in | Wt 143.2 lb

## 2021-04-01 DIAGNOSIS — I10 Essential (primary) hypertension: Secondary | ICD-10-CM

## 2021-04-01 DIAGNOSIS — E785 Hyperlipidemia, unspecified: Secondary | ICD-10-CM

## 2021-04-01 DIAGNOSIS — Z789 Other specified health status: Secondary | ICD-10-CM

## 2021-04-01 DIAGNOSIS — I2510 Atherosclerotic heart disease of native coronary artery without angina pectoris: Secondary | ICD-10-CM | POA: Diagnosis not present

## 2021-04-01 DIAGNOSIS — I4892 Unspecified atrial flutter: Secondary | ICD-10-CM

## 2021-04-01 MED ORDER — METOPROLOL TARTRATE 50 MG PO TABS: 50.0000 mg | ORAL_TABLET | Freq: Two times a day (BID) | ORAL | 3 refills | Status: DC

## 2021-04-01 MED ORDER — APIXABAN 5 MG PO TABS
5.0000 mg | ORAL_TABLET | Freq: Two times a day (BID) | ORAL | 6 refills | Status: AC
Start: 2021-04-01 — End: ?

## 2021-04-01 NOTE — Patient Instructions (Addendum)
Medication Instructions:  STOP- Aspirin 81 mg tablets STOP- Losartan/HCTZ tablets START- Eliquis 5 mg tablets twice daily START Lopressor 50 mg tablets twice daily  Follow-Up: Nurse Visit- Tuesday Follow up with Bernerd Pho, PA-C in 4-6 weeks   If you need a refill on your cardiac medications before your next appointment, please call your pharmacy.

## 2021-04-07 ENCOUNTER — Ambulatory Visit (INDEPENDENT_AMBULATORY_CARE_PROVIDER_SITE_OTHER): Payer: Medicare Other

## 2021-04-07 ENCOUNTER — Other Ambulatory Visit: Payer: Self-pay

## 2021-04-07 ENCOUNTER — Ambulatory Visit (HOSPITAL_COMMUNITY)
Admission: RE | Admit: 2021-04-07 | Discharge: 2021-04-07 | Disposition: A | Discharge status: Home / Self Care | Payer: Medicare Other | Source: Ambulatory Visit | Attending: Student | Admitting: Student

## 2021-04-07 DIAGNOSIS — R0609 Other forms of dyspnea: Secondary | ICD-10-CM

## 2021-04-07 DIAGNOSIS — I4892 Unspecified atrial flutter: Secondary | ICD-10-CM

## 2021-04-07 LAB — POCT I-STAT CREATININE: Creatinine, Ser: 0.8 mg/dL (ref 0.44–1.00)

## 2021-04-07 MED ORDER — IOHEXOL 350 MG/ML SOLN
100.0000 mL | Freq: Once | INTRAVENOUS | Status: AC | PRN
  Administered 2021-04-07: 75 mL via INTRAVENOUS

## 2021-04-07 MED ORDER — METOPROLOL TARTRATE 25 MG PO TABS
25.0000 mg | ORAL_TABLET | Freq: Two times a day (BID) | ORAL | 3 refills | Status: DC
Start: 2021-04-07 — End: 2021-04-13

## 2021-04-07 NOTE — Addendum Note (Signed)
Addended by: Berlinda Last on: 04/07/2021 02:47 PM   Modules accepted: Orders

## 2021-04-07 NOTE — Patient Instructions (Addendum)
Medication Instructions:  DECREASE Metoprolol to 25 mg tablets twice daily

## 2021-04-07 NOTE — Progress Notes (Signed)
° ° °  Patient presented for a nurse visit for an EKG and has converted to NSR. She reported chest pain along with significantly worsening dyspnea on exertion since this weekend. Given that Eliquis was just started last week and with recent conversion to NSR, there is concern for a PE. Recommended CTA for this reason. Will reduce Lopressor from 50mg  BID to 25mg  BID given her bradycardia.   Signed, Erma Heritage, PA-C 04/07/2021, 4:00 PM Pager: [phone]

## 2021-04-07 NOTE — Progress Notes (Signed)
Pt presents today for EKG d/t previously being in A. Flutter. Pt states shes been extremely short of breath all weekend.   Pt's EKG: 56 bpm

## 2021-04-07 NOTE — Addendum Note (Signed)
Addended by: Berlinda Last on: 04/07/2021 02:41 PM   Modules accepted: Orders

## 2021-04-10 ENCOUNTER — Telehealth: Payer: Self-pay | Admitting: Student

## 2021-04-10 NOTE — Telephone Encounter (Signed)
° ° °  It sounds like she is back in atrial flutter with RVR. If she is that short of breath and with her associated symptoms, I would recommend she go to the Emergency Department for immediate evaluation. Symptoms could be due to her arrhythmia, COPD or possible CHF as she has not yet had an echo as we waited on this at the time of her last visit due to her elevated rates. May ultimately require antiarrhythmic therapy given her recurrent episodes.   Signed, Erma Heritage, PA-C 04/10/2021, 4:09 PM Pager: [phone]

## 2021-04-10 NOTE — Telephone Encounter (Signed)
Spoke to pt who stated that her heart rate has been in the lower 40's until later this week when it jumped to 148 bpm. Pt stated she is having severe sob, as she cannot walk down three steps without having to use her inhaler. Pt denied cp, pressure. Pt also stated that she has been having hot flashes and gets sweaty.  Please advise.

## 2021-04-10 NOTE — Telephone Encounter (Signed)
STAT if HR is under 50 or over 120 (normal HR is 60-100 beats per minute)  What is your heart rate? 90  Do you have a log of your heart rate readings (document readings)? 44, 148, 144  Do you have any other symptoms? Sweating a little bit, when she drinks something both sides of her jaw have pain   Patient states yesterday her HR was 44 and then in the afternoon at 4 pm it was 148. She says this morning it went up to 144, but now has dropped to 90.

## 2021-04-10 NOTE — Telephone Encounter (Signed)
Spoke to pt who verbalized understanding. Pt stated she will wait for her husband to return home from work to go to the ER

## 2021-04-11 ENCOUNTER — Observation Stay (HOSPITAL_COMMUNITY)
Admission: EM | Admit: 2021-04-11 | Discharge: 2021-04-13 | Disposition: A | Discharge status: Home / Self Care | Payer: Medicare Other | Attending: Family Medicine | Admitting: Family Medicine

## 2021-04-11 ENCOUNTER — Emergency Department (HOSPITAL_COMMUNITY): Payer: Medicare Other

## 2021-04-11 ENCOUNTER — Encounter (HOSPITAL_COMMUNITY): Payer: Self-pay

## 2021-04-11 ENCOUNTER — Other Ambulatory Visit: Payer: Self-pay

## 2021-04-11 DIAGNOSIS — I4892 Unspecified atrial flutter: Secondary | ICD-10-CM | POA: Insufficient documentation

## 2021-04-11 DIAGNOSIS — Z79899 Other long term (current) drug therapy: Secondary | ICD-10-CM | POA: Insufficient documentation

## 2021-04-11 DIAGNOSIS — R0789 Other chest pain: Secondary | ICD-10-CM

## 2021-04-11 DIAGNOSIS — R7401 Elevation of levels of liver transaminase levels: Secondary | ICD-10-CM | POA: Diagnosis not present

## 2021-04-11 DIAGNOSIS — Z7901 Long term (current) use of anticoagulants: Secondary | ICD-10-CM | POA: Insufficient documentation

## 2021-04-11 DIAGNOSIS — I480 Paroxysmal atrial fibrillation: Principal | ICD-10-CM | POA: Insufficient documentation

## 2021-04-11 DIAGNOSIS — E871 Hypo-osmolality and hyponatremia: Secondary | ICD-10-CM | POA: Diagnosis not present

## 2021-04-11 DIAGNOSIS — I110 Hypertensive heart disease with heart failure: Secondary | ICD-10-CM | POA: Insufficient documentation

## 2021-04-11 DIAGNOSIS — Z720 Tobacco use: Secondary | ICD-10-CM

## 2021-04-11 DIAGNOSIS — J449 Chronic obstructive pulmonary disease, unspecified: Secondary | ICD-10-CM | POA: Diagnosis not present

## 2021-04-11 DIAGNOSIS — Z20822 Contact with and (suspected) exposure to covid-19: Secondary | ICD-10-CM | POA: Diagnosis not present

## 2021-04-11 DIAGNOSIS — F419 Anxiety disorder, unspecified: Secondary | ICD-10-CM | POA: Diagnosis not present

## 2021-04-11 DIAGNOSIS — I5030 Unspecified diastolic (congestive) heart failure: Secondary | ICD-10-CM | POA: Diagnosis not present

## 2021-04-11 DIAGNOSIS — F17200 Nicotine dependence, unspecified, uncomplicated: Secondary | ICD-10-CM | POA: Diagnosis present

## 2021-04-11 DIAGNOSIS — F32A Depression, unspecified: Secondary | ICD-10-CM | POA: Insufficient documentation

## 2021-04-11 DIAGNOSIS — I2510 Atherosclerotic heart disease of native coronary artery without angina pectoris: Secondary | ICD-10-CM | POA: Diagnosis not present

## 2021-04-11 DIAGNOSIS — I700 Atherosclerosis of aorta: Secondary | ICD-10-CM | POA: Insufficient documentation

## 2021-04-11 DIAGNOSIS — F1010 Alcohol abuse, uncomplicated: Secondary | ICD-10-CM | POA: Insufficient documentation

## 2021-04-11 DIAGNOSIS — Z87891 Personal history of nicotine dependence: Secondary | ICD-10-CM | POA: Insufficient documentation

## 2021-04-11 DIAGNOSIS — R7989 Other specified abnormal findings of blood chemistry: Secondary | ICD-10-CM | POA: Diagnosis present

## 2021-04-11 DIAGNOSIS — I10 Essential (primary) hypertension: Secondary | ICD-10-CM | POA: Diagnosis present

## 2021-04-11 DIAGNOSIS — R0602 Shortness of breath: Secondary | ICD-10-CM | POA: Diagnosis not present

## 2021-04-11 DIAGNOSIS — R079 Chest pain, unspecified: Secondary | ICD-10-CM | POA: Diagnosis not present

## 2021-04-11 DIAGNOSIS — R071 Chest pain on breathing: Secondary | ICD-10-CM | POA: Diagnosis present

## 2021-04-11 DIAGNOSIS — F418 Other specified anxiety disorders: Secondary | ICD-10-CM | POA: Diagnosis present

## 2021-04-11 LAB — CBC
HCT: 46.6 % — ABNORMAL HIGH (ref 36.0–46.0)
Hemoglobin: 15.9 g/dL — ABNORMAL HIGH (ref 12.0–15.0)
MCH: 33.1 pg (ref 26.0–34.0)
MCHC: 34.1 g/dL (ref 30.0–36.0)
MCV: 97.1 fL (ref 80.0–100.0)
Platelets: 282 10*3/uL (ref 150–400)
RBC: 4.8 MIL/uL (ref 3.87–5.11)
RDW: 13.3 % (ref 11.5–15.5)
WBC: 6.5 10*3/uL (ref 4.0–10.5)
nRBC: 0 % (ref 0.0–0.2)

## 2021-04-11 LAB — HEPATITIS PANEL, ACUTE
HCV Ab: NONREACTIVE
Hep A IgM: NONREACTIVE
Hep B C IgM: NONREACTIVE
Hepatitis B Surface Ag: NONREACTIVE

## 2021-04-11 LAB — RESP PANEL BY RT-PCR (FLU A&B, COVID) ARPGX2
Influenza A by PCR: NEGATIVE
Influenza B by PCR: NEGATIVE
SARS Coronavirus 2 by RT PCR: NEGATIVE

## 2021-04-11 LAB — BRAIN NATRIURETIC PEPTIDE: B Natriuretic Peptide: 378 pg/mL — ABNORMAL HIGH (ref 0.0–100.0)

## 2021-04-11 LAB — COMPREHENSIVE METABOLIC PANEL
ALT: 75 U/L — ABNORMAL HIGH (ref 0–44)
AST: 39 U/L (ref 15–41)
Albumin: 3.8 g/dL (ref 3.5–5.0)
Alkaline Phosphatase: 86 U/L (ref 38–126)
Anion gap: 12 (ref 5–15)
BUN: 15 mg/dL (ref 8–23)
CO2: 22 mmol/L (ref 22–32)
Calcium: 9 mg/dL (ref 8.9–10.3)
Chloride: 97 mmol/L — ABNORMAL LOW (ref 98–111)
Creatinine, Ser: 0.81 mg/dL (ref 0.44–1.00)
GFR, Estimated: >60 mL/min (ref >60)
Glucose, Bld: 147 mg/dL — ABNORMAL HIGH (ref 70–99)
Potassium: 4 mmol/L (ref 3.5–5.1)
Sodium: 131 mmol/L — ABNORMAL LOW (ref 135–145)
Total Bilirubin: 0.3 mg/dL (ref 0.3–1.2)
Total Protein: 6.9 g/dL (ref 6.5–8.1)

## 2021-04-11 LAB — TROPONIN I (HIGH SENSITIVITY)
Troponin I (High Sensitivity): 4 ng/L (ref <18)
Troponin I (High Sensitivity): 4 ng/L (ref <18)

## 2021-04-11 LAB — D-DIMER, QUANTITATIVE: D-Dimer, Quant: 0.43 ug/mL-FEU (ref 0.00–0.50)

## 2021-04-11 LAB — TSH: TSH: 5.001 u[IU]/mL — ABNORMAL HIGH (ref 0.350–4.500)

## 2021-04-11 LAB — MAGNESIUM: Magnesium: 2.3 mg/dL (ref 1.7–2.4)

## 2021-04-11 MED ORDER — DIAZEPAM 5 MG PO TABS
5.0000 mg | ORAL_TABLET | Freq: Three times a day (TID) | ORAL | Status: DC
  Administered 2021-04-11 (×2): 5 mg via ORAL
  Filled 2021-04-11 (×3): qty 1

## 2021-04-11 MED ORDER — UMECLIDINIUM-VILANTEROL 62.5-25 MCG/ACT IN AEPB
1.0000 | INHALATION_SPRAY | Freq: Every day | RESPIRATORY_TRACT | Status: DC
  Administered 2021-04-12 – 2021-04-13 (×2): 1 via RESPIRATORY_TRACT
  Filled 2021-04-11: qty 14

## 2021-04-11 MED ORDER — POLYETHYLENE GLYCOL 3350 17 G PO PACK: 17.0000 g | PACK | Freq: Every day | ORAL | Status: DC | PRN

## 2021-04-11 MED ORDER — HEPARIN BOLUS VIA INFUSION
4000.0000 [IU] | Freq: Once | INTRAVENOUS | Status: AC
  Administered 2021-04-11: 4000 [IU] via INTRAVENOUS

## 2021-04-11 MED ORDER — ONDANSETRON HCL 4 MG PO TABS: 4.0000 mg | ORAL_TABLET | Freq: Four times a day (QID) | ORAL | Status: DC | PRN

## 2021-04-11 MED ORDER — METOPROLOL TARTRATE 25 MG PO TABS
25.0000 mg | ORAL_TABLET | Freq: Once | ORAL | Status: AC
  Administered 2021-04-11: 25 mg via ORAL
  Filled 2021-04-11: qty 1

## 2021-04-11 MED ORDER — SODIUM CHLORIDE 0.9% FLUSH: 3.0000 mL | INTRAVENOUS | Status: DC | PRN

## 2021-04-11 MED ORDER — FOLIC ACID 1 MG PO TABS
1.0000 mg | ORAL_TABLET | Freq: Every day | ORAL | Status: DC
  Administered 2021-04-11 – 2021-04-13 (×3): 1 mg via ORAL
  Filled 2021-04-11 (×3): qty 1

## 2021-04-11 MED ORDER — SODIUM CHLORIDE 0.9 % IV BOLUS
1000.0000 mL | Freq: Once | INTRAVENOUS | Status: AC
  Administered 2021-04-11: 1000 mL via INTRAVENOUS

## 2021-04-11 MED ORDER — SODIUM CHLORIDE 0.9% FLUSH
3.0000 mL | Freq: Two times a day (BID) | INTRAVENOUS | Status: DC
  Administered 2021-04-13: 3 mL via INTRAVENOUS

## 2021-04-11 MED ORDER — LORAZEPAM 2 MG/ML IJ SOLN: 1.0000 mg | INTRAMUSCULAR | Status: DC | PRN

## 2021-04-11 MED ORDER — ASPIRIN 81 MG PO CHEW
324.0000 mg | CHEWABLE_TABLET | Freq: Once | ORAL | Status: AC
  Administered 2021-04-11: 324 mg via ORAL
  Filled 2021-04-11: qty 4

## 2021-04-11 MED ORDER — ACETAMINOPHEN 650 MG RE SUPP: 650.0000 mg | Freq: Four times a day (QID) | RECTAL | Status: DC | PRN

## 2021-04-11 MED ORDER — THIAMINE HCL 100 MG/ML IJ SOLN: 100.0000 mg | Freq: Every day | INTRAMUSCULAR | Status: DC

## 2021-04-11 MED ORDER — SODIUM CHLORIDE 0.9% FLUSH
3.0000 mL | Freq: Two times a day (BID) | INTRAVENOUS | Status: DC
  Administered 2021-04-11 – 2021-04-13 (×4): 3 mL via INTRAVENOUS

## 2021-04-11 MED ORDER — TRAZODONE HCL 50 MG PO TABS
50.0000 mg | ORAL_TABLET | Freq: Every evening | ORAL | Status: DC | PRN
  Administered 2021-04-11 – 2021-04-12 (×2): 50 mg via ORAL
  Filled 2021-04-11 (×2): qty 1

## 2021-04-11 MED ORDER — METOPROLOL TARTRATE 25 MG PO TABS
25.0000 mg | ORAL_TABLET | Freq: Two times a day (BID) | ORAL | Status: DC
  Administered 2021-04-12 – 2021-04-13 (×3): 25 mg via ORAL
  Filled 2021-04-11 (×3): qty 1

## 2021-04-11 MED ORDER — ACETAMINOPHEN 325 MG PO TABS: 650.0000 mg | ORAL_TABLET | Freq: Four times a day (QID) | ORAL | Status: DC | PRN

## 2021-04-11 MED ORDER — HEPARIN (PORCINE) 25000 UT/250ML-% IV SOLN
1050.0000 [IU]/h | INTRAVENOUS | Status: DC
  Administered 2021-04-11: 16:00:00 900 [IU]/h via INTRAVENOUS
  Administered 2021-04-12: 13:00:00 1050 [IU]/h via INTRAVENOUS
  Filled 2021-04-11 (×2): qty 250

## 2021-04-11 MED ORDER — LORAZEPAM 1 MG PO TABS: 1.0000 mg | ORAL_TABLET | ORAL | Status: DC | PRN

## 2021-04-11 MED ORDER — ROSUVASTATIN CALCIUM 10 MG PO TABS
5.0000 mg | ORAL_TABLET | Freq: Every day | ORAL | Status: DC
  Administered 2021-04-12 – 2021-04-13 (×2): 5 mg via ORAL
  Filled 2021-04-11 (×2): qty 1

## 2021-04-11 MED ORDER — THIAMINE HCL 100 MG PO TABS
100.0000 mg | ORAL_TABLET | Freq: Every day | ORAL | Status: DC
  Administered 2021-04-11 – 2021-04-13 (×3): 100 mg via ORAL
  Filled 2021-04-11 (×3): qty 1

## 2021-04-11 MED ORDER — NICOTINE 21 MG/24HR TD PT24
21.0000 mg | MEDICATED_PATCH | Freq: Every day | TRANSDERMAL | Status: DC
  Administered 2021-04-11 – 2021-04-13 (×3): 21 mg via TRANSDERMAL
  Filled 2021-04-11 (×3): qty 1

## 2021-04-11 MED ORDER — BUPROPION HCL ER (SR) 150 MG PO TB12
150.0000 mg | ORAL_TABLET | Freq: Two times a day (BID) | ORAL | Status: DC
  Administered 2021-04-11 – 2021-04-13 (×4): 150 mg via ORAL
  Filled 2021-04-11 (×5): qty 1

## 2021-04-11 MED ORDER — SODIUM CHLORIDE 0.9 % IV SOLN: 250.0000 mL | INTRAVENOUS | Status: DC | PRN

## 2021-04-11 MED ORDER — CHLORHEXIDINE GLUCONATE CLOTH 2 % EX PADS
6.0000 | MEDICATED_PAD | Freq: Every day | CUTANEOUS | Status: DC
  Administered 2021-04-11 – 2021-04-13 (×3): 6 via TOPICAL

## 2021-04-11 MED ORDER — DULOXETINE HCL 60 MG PO CPEP
60.0000 mg | ORAL_CAPSULE | Freq: Two times a day (BID) | ORAL | Status: DC
  Administered 2021-04-11 – 2021-04-13 (×4): 60 mg via ORAL
  Filled 2021-04-11 (×4): qty 1

## 2021-04-11 MED ORDER — ALBUTEROL SULFATE (2.5 MG/3ML) 0.083% IN NEBU
2.5000 mg | INHALATION_SOLUTION | RESPIRATORY_TRACT | Status: DC | PRN
Start: 2021-04-11 — End: 2021-04-13
  Administered 2021-04-13: 2.5 mg via RESPIRATORY_TRACT
  Filled 2021-04-11: qty 3

## 2021-04-11 MED ORDER — BISACODYL 10 MG RE SUPP: 10.0000 mg | Freq: Every day | RECTAL | Status: DC | PRN

## 2021-04-11 MED ORDER — ONDANSETRON HCL 4 MG/2ML IJ SOLN
4.0000 mg | Freq: Four times a day (QID) | INTRAMUSCULAR | Status: DC | PRN
Start: 2021-04-11 — End: 2021-04-13

## 2021-04-11 MED ORDER — METOPROLOL TARTRATE 5 MG/5ML IV SOLN
5.0000 mg | Freq: Once | INTRAVENOUS | Status: AC
  Administered 2021-04-11: 5 mg via INTRAVENOUS
  Filled 2021-04-11: qty 5

## 2021-04-11 MED ORDER — GABAPENTIN 300 MG PO CAPS
300.0000 mg | ORAL_CAPSULE | Freq: Two times a day (BID) | ORAL | Status: DC
  Administered 2021-04-11 – 2021-04-13 (×4): 300 mg via ORAL
  Filled 2021-04-11 (×4): qty 1

## 2021-04-11 MED ORDER — ADULT MULTIVITAMIN W/MINERALS CH
1.0000 | ORAL_TABLET | Freq: Every day | ORAL | Status: DC
  Administered 2021-04-11 – 2021-04-13 (×3): 1 via ORAL
  Filled 2021-04-11 (×3): qty 1

## 2021-04-11 MED ORDER — MAGNESIUM SULFATE 2 GM/50ML IV SOLN
2.0000 g | Freq: Once | INTRAVENOUS | Status: AC
  Administered 2021-04-11: 2 g via INTRAVENOUS
  Filled 2021-04-11: qty 50

## 2021-04-11 MED ORDER — SODIUM CHLORIDE 0.9 % IV SOLN: INTRAVENOUS | Status: AC

## 2021-04-11 MED ORDER — DILTIAZEM HCL-DEXTROSE 125-5 MG/125ML-% IV SOLN (PREMIX)
5.0000 mg/h | INTRAVENOUS | Status: AC
  Administered 2021-04-11: 5 mg/h via INTRAVENOUS
  Filled 2021-04-11: qty 125

## 2021-04-11 MED ORDER — SODIUM CHLORIDE 0.9 % IV SOLN: INTRAVENOUS | Status: DC

## 2021-04-11 MED ORDER — ISOSORBIDE MONONITRATE ER 30 MG PO TB24
15.0000 mg | ORAL_TABLET | Freq: Every day | ORAL | Status: DC
  Administered 2021-04-12 – 2021-04-13 (×2): 15 mg via ORAL
  Filled 2021-04-11 (×2): qty 1

## 2021-04-11 NOTE — H&P (Signed)
Patient Demographics:    Paige Zimmerman, is a 71 y.o. female  MRN: [id number]   DOB - 30-Mar-1950  Admit Date - 04/11/2021  Outpatient Primary MD for the patient is Caryl Bis, MD   Assessment & Plan:   Assessment and Plan:  A/p 1) atrial flutter/atrial fibrillation with RVR on 3-1 AV block-----EDP discussed case with on-call cardiologist --Patient thought she was supposed to go off the Eliquis, so she has not taken Eliquis since 04/07/2021--- -Cardiology recommended IV heparin and IV Cardizem drip -Okay to restart metoprolol for rate control -Check TSH, check echocardiogram --Magnesium is 2.3, potassium is 4.0   2)H/o CAD--/p NST in 05/2019 showing ischemia with cath showing mild nonobstructive CAD-- -CTA chest on 04/07/2021 with signs of aortic atherosclerosis and three-vessel CAD -Patient's chest discomfort and dyspnea on exertion may be related to #1 above --Initial troponin is 4, repeat troponin at 4 -EKG consistent with atrial flutter with 3-1 AV block -Patient is ruled out for ACS by cardiac enzymes and EKG at this time given duration of her symptoms -Continue IV heparin as above #1 -Continue Crestor and isosorbide and metoprolol  3)HFpEF/Acute dCHF exacerbation--suspect acute exacerbation of chronic diastolic dysfunction CHF secondary to a flutter with RVR -EF from [facility] of April 2021 was 55 to 65% -BNP 378, chest x-ray without acute cardiopulmonary findings -Patient with dyspnea at rest and dyspnea on exertion most likely due to #1 above as noted -Get echocardiogram  4)Hyponatremia--- sodium is 131, suspect this is related to beer potomania --Hydrate gently, avoid excessive beer intake -Also avoid excessive free water intake at this time  5)Elevated LFTs---ALT 75, AST is 39 T. bili 0.3 -AST to ALT  ratio is not consistent with alcoholic hepatitis -Checking Viral hepatitis profile -Continue Crestor unless LFTs continue to increase to more than 3 times normal levels  6)Tobacco Abuse/COPD----no acute exacerbation, nicotine patch as prescribed, bronchodilators as prescribed  7)Alcohol Abuse--patient drinks up to 10 beers a day, mostly every day--- high risk for DTs -Lorazepam per CIWA protocol, folic acid and thiamine as ordered  8) depression/anxiety--- stable, denies SI/HI -Continue Cymbalta and Wellbutrin  Disposition/Need for in-Hospital Stay- patient unable to be discharged at this time due to   Status is: Inpatient  Remains inpatient appropriate because:   Dispo: The patient is from: Home              Anticipated d/c is to: Home              Anticipated d/c date is: 1 day              Patient currently is not medically stable to d/c. Barriers: Not Clinically Stable-   With History of - Reviewed by me  Past Medical History:  Diagnosis Date   ADHD    CAD (coronary artery disease)    a. 05/2019: cath showing minimal nonobstructive CAD with risk factor modification recommended.   COPD (chronic obstructive  pulmonary disease) ([place])    Depression    Hypertension    Osteoporosis    PVD (peripheral vascular disease) ([place])    Tobacco abuse       Past Surgical History:  Procedure Laterality Date   APPENDECTOMY  1975   CHOLECYSTECTOMY  2007   LEFT HEART CATH AND CORONARY ANGIOGRAPHY N/A 06/15/2019   Procedure: LEFT HEART CATH AND CORONARY ANGIOGRAPHY;  Surgeon: Jettie Booze, MD;  Location: [place] [facility];  Service: Cardiovascular;  Laterality: N/A;   NECK SURGERY  2010   OVARY SURGERY     TOTAL ABDOMINAL HYSTERECTOMY  1975   VENTRAL HERNIA REPAIR  1985   WRIST SURGERY Left 09/19/2018      Chief Complaint  Patient presents with   Shortness of Breath   Chest Pain      HPI:    Paige Zimmerman  is a 71 y.o. female with past medical history relevant for  alcohol and tobacco abuse, osteopenia, depression/anxiety, history of  CAD (s/p NST in 05/2019 showing ischemia with cath showing mild nonobstructive CAD), HTN, COPD and paroxysmal atrial flutter/atrial fibrillation who presents to the ED with recurrent episodes of palpitations, dizziness, dyspnea on exertion and occasional chest discomfort at times with activity over the last several days - Patient has been following up with cardiology service for management of A-fib/atrial flutter with RVR, she was recently started on Eliquis, apparently she became bradycardic on metoprolol so metoprolol was reduced to 25 mg twice daily from 50 twice daily -Patient thought she was supposed to go off the Eliquis , so she has not taken Eliquis since 04/07/2021--- -She had a CTA chest on 04/07/2021 showing no PE however there were signs of aortic atherosclerosis and three-vessel CAD--- -Patient cardiology service on 04/10/2021 with above complaints and was advised to come to the ED for further evaluation -In the ED she is found to be in atrial flutter with RVR and 3-1 block -EDP discussed case with on-call cardiologist who recommends IV heparin and IV Cardizem drip - CBC largely WNL -Initial troponin is 4, repeat troponin at 4 -EKG consistent with atrial flutter with 3-1 AV block -Magnesium is 2.3, potassium is 4.0 sodium is 131 ALT 75, AST is 39 T. bili 0.3 -Flu and COVID-negative -D-dimer 0.43, BNP 378, chest x-ray without acute cardiopulmonary findings  Review of systems:    In addition to the HPI above,   A full Review of  Systems was done, all other systems reviewed are negative except as noted above in HPI , .    Social History:  Reviewed by me    Social History   Tobacco Use   Smoking status: Every Day    Packs/day: 1.00    Types: Cigarettes   Smokeless tobacco: Never  Substance Use Topics   Alcohol use: Yes    Comment: Everyday 3-8 cans of beer     Family History :  Reviewed by me     Family History  Problem Relation Age of Onset   Heart attack Father 33   Heart attack Sister 5   Hypertension Mother     Home Medications:   Prior to Admission medications   Medication Sig Start Date End Date Taking? Authorizing Provider  acetaminophen (TYLENOL) 500 MG tablet Take 1,000 mg by mouth every 6 (six) hours as needed (for pain/headaches.). Patient not taking: Reported on 04/01/2021    [provider]  albuterol (VENTOLIN HFA) 108 (90 Base) MCG/ACT inhaler Inhale 1-2 puffs into  the lungs every 6 (six) hours as needed for wheezing or shortness of breath.    [provider]  alendronate (FOSAMAX) 70 MG tablet Take 70 mg by mouth every Monday. Take with a full glass of water on an empty stomach.  Patient not taking: Reported on 04/01/2021    [provider]  apixaban (ELIQUIS) 5 MG TABS tablet Take 1 tablet (5 mg total) by mouth 2 (two) times daily. 04/01/21   Ahmed Prima, Fransisco Hertz, PA-C  buPROPion (WELLBUTRIN SR) 150 MG 12 hr tablet Take 150 mg by mouth 2 (two) times daily.    [provider]  calcium carbonate (OSCAL) 1500 (600 Ca) MG TABS tablet Take 1 tablet by mouth 2 (two) times daily with a meal.  Patient not taking: Reported on 04/01/2021    [provider]  cetirizine (ZYRTEC) 10 MG tablet Take 10 mg by mouth at bedtime.     [provider]  DULoxetine (CYMBALTA) 60 MG capsule Take 60 mg by mouth 2 (two) times daily.    [provider]  gabapentin (NEURONTIN) 300 MG capsule Take 300 mg by mouth 2 (two) times daily.    [provider]  isosorbide mononitrate (IMDUR) 30 MG 24 hr tablet Take 0.5 tablets (15 mg total) by mouth daily. 06/08/19 04/01/21  Arnoldo Lenis, MD  metoprolol tartrate (LOPRESSOR) 25 MG tablet Take 1 tablet (25 mg total) by mouth 2 (two) times daily. 04/07/21 07/06/21  Strader, Fransisco Hertz, PA-C  nitroGLYCERIN (NITROSTAT) 0.4 MG SL tablet Place 1 tablet (0.4 mg total) under the tongue every 5  (five) minutes as needed for chest pain. 06/08/19 04/01/21  Arnoldo Lenis, MD  rosuvastatin (CRESTOR) 5 MG tablet Take 5 mg by mouth daily. 03/10/21   [provider]  umeclidinium-vilanterol (ANORO ELLIPTA) 62.5-25 MCG/INH AEPB Inhale 1 puff into the lungs daily.    [provider]     Allergies:     Allergies  Allergen Reactions   Ace Inhibitors Cough   Sulfa Antibiotics Hives   Penicillins Rash     Physical Exam:   Vitals  Blood pressure (!) 134/93, pulse (!) 105, temperature 97.8 F (36.6 C), temperature source Oral, resp. rate 17, height 5\' 3"  (1.6 m), weight 65.8 kg, SpO2 98 %.  Physical Examination: General appearance - alert, and in no distress  Mental status - alert, oriented to person, place, and time,  Eyes - sclera anicteric Neck - supple, no JVD elevation , Chest - clear  to auscultation bilaterally, symmetrical air movement,  Heart - S1 and S2 normal, irregularly irregular and tachycardic Abdomen - soft, nontender, nondistended, no masses or organomegaly Neurological - screening mental status exam normal, neck supple without rigidity, cranial nerves II through XII intact, DTR's normal and symmetric Extremities - no pedal edema noted, intact peripheral pulses  Skin - warm, dry   Data Review:    CBC Recent Labs  Lab 04/11/21 1109  WBC 6.5  HGB 15.9*  HCT 46.6*  PLT 282  MCV 97.1  MCH 33.1  MCHC 34.1  RDW 13.3   ------------------------------------------------------------------------------------------------------------------  Chemistries  Recent Labs  Lab 04/07/21 1558 04/11/21 1109 04/11/21 1203  NA  --  131*  --   K  --  4.0  --   CL  --  97*  --   CO2  --  22  --   GLUCOSE  --  147*  --   BUN  --  15  --  CREATININE 0.80 0.81  --   CALCIUM  --  9.0  --   MG  --   --  2.3  AST  --  39  --   ALT  --  75*  --   ALKPHOS  --  86  --   BILITOT  --  0.3  --     ------------------------------------------------------------------------------------------------------------------ estimated creatinine clearance is 60.7 mL/min (by C-G formula based on SCr of 0.81 mg/dL). ------------------------------------------------------------------------------------------------------------------ No results for input(s): TSH, T4TOTAL, T3FREE, THYROIDAB in the last 72 hours.  Invalid input(s): FREET3   Coagulation profile No results for input(s): INR, PROTIME in the last 168 hours. ------------------------------------------------------------------------------------------------------------------- Recent Labs    04/11/21 1109  DDIMER 0.43   -------------------------------------------------------------------------------------------------------------------  Cardiac Enzymes No results for input(s): CKMB, TROPONINI, MYOGLOBIN in the last 168 hours.  Invalid input(s): CK ------------------------------------------------------------------------------------------------------------------    Component Value Date/Time   BNP 378.0 (H) 04/11/2021 1109    Urinalysis    Component Value Date/Time   COLORURINE YELLOW 01/07/2009 1005   APPEARANCEUR CLEAR 01/07/2009 1005   LABSPEC 1.021 01/07/2009 1005   PHURINE 7.0 01/07/2009 1005   GLUCOSEU NEGATIVE 01/07/2009 1005   HGBUR NEGATIVE 01/07/2009 1005   BILIRUBINUR NEGATIVE 01/07/2009 1005   KETONESUR NEGATIVE 01/07/2009 1005   PROTEINUR NEGATIVE 01/07/2009 1005   UROBILINOGEN 0.2 01/07/2009 1005   NITRITE NEGATIVE 01/07/2009 1005   LEUKOCYTESUR  01/07/2009 1005    NEGATIVE MICROSCOPIC NOT DONE ON URINES WITH NEGATIVE PROTEIN, BLOOD, LEUKOCYTES, NITRITE, OR GLUCOSE <1000 mg/dL.    ----------------------------------------------------------------------------------------------------------------   Imaging Results:    DG Chest Port 1 View  Result Date: 04/11/2021 CLINICAL DATA:  Chest pain EXAM: PORTABLE CHEST 1 VIEW  COMPARISON:  Radiograph 02/09/2021 FINDINGS: Unchanged cardiomediastinal silhouette. No focal airspace consolidation. No large pleural effusion. No visible pneumothorax. Cervical spine fusion hardware noted. Bilateral shoulder osteoarthritis. Thoracic spondylosis. Fat containing left medial basilar hernia as seen on recent CT. IMPRESSION: No evidence of acute cardiopulmonary disease. Electronically Signed   By: Maurine Simmering M.D.   On: 04/11/2021 11:45    Radiological Exams on Admission: DG Chest Port 1 View  Result Date: 04/11/2021 CLINICAL DATA:  Chest pain EXAM: PORTABLE CHEST 1 VIEW COMPARISON:  Radiograph 02/09/2021 FINDINGS: Unchanged cardiomediastinal silhouette. No focal airspace consolidation. No large pleural effusion. No visible pneumothorax. Cervical spine fusion hardware noted. Bilateral shoulder osteoarthritis. Thoracic spondylosis. Fat containing left medial basilar hernia as seen on recent CT. IMPRESSION: No evidence of acute cardiopulmonary disease. Electronically Signed   By: Maurine Simmering M.D.   On: 04/11/2021 11:45    DVT Prophylaxis -SCD/Iv heparin AM Labs Ordered, also please review Full Orders  Family Communication: Admission, patients condition and plan of care including tests being ordered have been discussed with the patient who indicate understanding and agree with the plan   Code Status - Full Code  Likely DC to  home after appropriate rate control  Condition   stable  Roxan Hockey M.D on 04/11/2021 at 4:10 PM Go to [email or web address] -  for contact info  [facility] - Office  [phone]

## 2021-04-11 NOTE — ED Triage Notes (Signed)
Pt arrived with complaints of chest pressure mid chest that radiates to left shoulder blade and increased SOB with ambulation. Pt states HR has been irregular.

## 2021-04-11 NOTE — ED Notes (Signed)
Pt given meal tray for dinner

## 2021-04-11 NOTE — Progress Notes (Addendum)
ANTICOAGULATION CONSULT NOTE - Initial Consult  Pharmacy Consult for Heparin Indication: atrial fibrillation  Allergies  Allergen Reactions   Ace Inhibitors Cough   Sulfa Antibiotics Hives   Penicillins Rash    Patient Measurements: Height: 5\' 3"  (160 cm) Weight: 65.8 kg (145 lb) IBW/kg (Calculated) : 52.4 HEPARIN DW (KG): 65.6   Vital Signs: Temp: 97.8 F (36.6 C) (02/18 1105) Temp Source: Oral (02/18 1105) BP: 134/93 (02/18 1530) Pulse Rate: 105 (02/18 1530)  Labs: Recent Labs    04/11/21 1109 04/11/21 1331  HGB 15.9*  --   HCT 46.6*  --   PLT 282  --   CREATININE 0.81  --   TROPONINIHS 4 4    Estimated Creatinine Clearance: 60.7 mL/min (by C-G formula based on SCr of 0.81 mg/dL).   Medical History: Past Medical History:  Diagnosis Date   ADHD    CAD (coronary artery disease)    a. 05/2019: cath showing minimal nonobstructive CAD with risk factor modification recommended.   COPD (chronic obstructive pulmonary disease) ([facility])    Depression    Hypertension    Osteoporosis    PVD (peripheral vascular disease) ([facility])    Tobacco abuse     Medications:  (Not in a hospital admission)   Assessment: Pt presents w/ chest pressure, pain.  Reports HR irregular.  Asked to initiate Heparin for atrial fibrillation.  Goal of Therapy:  Heparin level 0.3-0.7 units/ml Monitor platelets by anticoagulation protocol: Yes   Plan:  Heparin bolus 4000 units then infusion at 900 units/hr Check HL tonight ~11pm Monitor CBC, platelets  Hart Robinsons A 04/11/2021,3:48 PM

## 2021-04-11 NOTE — ED Provider Notes (Signed)
[facility] EMERGENCY DEPARTMENT Provider Note   CSN: [id number] Arrival date & time: 04/11/21  1048     History  Chief Complaint  Patient presents with   Shortness of Breath   Chest Pain    Paige Zimmerman is a 71 y.o. female.  This is a 71 y.o. female  with significant medical history as below, including chronic alcohol abuse with 5 to 8 cans of beer daily, hypertension, COPD, CAD, PVD, afib (Dr branch cardiolog who presents to the ED with complaint of chest pain, dyspnea, palpitations, diaphoresis.  Patient with history of atrial flutter/atrial fibrillation.  Recently discontinued from Eliquis per pt although I was unable to see documentation of this in her recent office notes.  Her metoprolol was also reduced as she was becoming bradycardic. Now taking 25mg  BID PO.  Patient reports the past 2 to 3 days she has been having sensation of exertional dyspnea, chest tightness.  She has been checking her heart rate at home and has been in the 130s to 150s.  He has been compliant with her medications.  She called cardiology office yesterday who advised her to come to the ER for evaluation.  Patient reports daily alcohol abuse.  No history of alcohol withdrawal seizure, last alcohol intake was earlier today.  Also chronic tobacco use.  Baseline cough is unchanged.  No change to sputum production.    Past Medical History: No date: ADHD No date: CAD (coronary artery disease)     Comment:  a. 05/2019: cath showing minimal nonobstructive CAD with              risk factor modification recommended. No date: COPD (chronic obstructive pulmonary disease) (HCC) No date: Depression No date: Hypertension No date: Osteoporosis No date: PVD (peripheral vascular disease) ([place]) No date: Tobacco abuse  Past Surgical History: 1975: APPENDECTOMY 2007: CHOLECYSTECTOMY 06/15/2019: LEFT HEART CATH AND CORONARY ANGIOGRAPHY; N/A     Comment:  Procedure: LEFT HEART CATH AND CORONARY ANGIOGRAPHY;                 Surgeon: Jettie Booze, MD;  Location: [facility]              CV LAB;  Service: Cardiovascular;  Laterality: N/A; 2010: NECK SURGERY No date: OVARY SURGERY 1975: TOTAL ABDOMINAL HYSTERECTOMY 1985: VENTRAL HERNIA REPAIR 09/19/2018: WRIST SURGERY; Left    The history is provided by the patient. No language interpreter was used.  Shortness of Breath Associated symptoms: chest pain and diaphoresis   Associated symptoms: no abdominal pain, no cough, no fever, no headaches and no rash   Chest Pain Associated symptoms: diaphoresis and shortness of breath   Associated symptoms: no abdominal pain, no back pain, no cough, no dysphagia, no fever, no headache, no nausea and no palpitations       Home Medications Prior to Admission medications   Medication Sig Start Date End Date Taking? Authorizing Provider  acetaminophen (TYLENOL) 500 MG tablet Take 1,000 mg by mouth every 6 (six) hours as needed (for pain/headaches.). Patient not taking: Reported on 04/01/2021    [provider]  albuterol (VENTOLIN HFA) 108 (90 Base) MCG/ACT inhaler Inhale 1-2 puffs into the lungs every 6 (six) hours as needed for wheezing or shortness of breath.    [provider]  alendronate (FOSAMAX) 70 MG tablet Take 70 mg by mouth every Monday. Take with a full glass of water on an empty stomach.  Patient not taking: Reported on 04/01/2021  [provider]  apixaban (ELIQUIS) 5 MG TABS tablet Take 1 tablet (5 mg total) by mouth 2 (two) times daily. 04/01/21   Ahmed Prima, Fransisco Hertz, PA-C  buPROPion (WELLBUTRIN SR) 150 MG 12 hr tablet Take 150 mg by mouth 2 (two) times daily.    [provider]  calcium carbonate (OSCAL) 1500 (600 Ca) MG TABS tablet Take 1 tablet by mouth 2 (two) times daily with a meal.  Patient not taking: Reported on 04/01/2021    [provider]  cetirizine (ZYRTEC) 10 MG tablet Take 10 mg by mouth at bedtime.     [provider]  DULoxetine  (CYMBALTA) 60 MG capsule Take 60 mg by mouth 2 (two) times daily.    [provider]  gabapentin (NEURONTIN) 300 MG capsule Take 300 mg by mouth 2 (two) times daily.    [provider]  isosorbide mononitrate (IMDUR) 30 MG 24 hr tablet Take 0.5 tablets (15 mg total) by mouth daily. 06/08/19 04/01/21  Arnoldo Lenis, MD  metoprolol tartrate (LOPRESSOR) 25 MG tablet Take 1 tablet (25 mg total) by mouth 2 (two) times daily. 04/07/21 07/06/21  Strader, Fransisco Hertz, PA-C  nitroGLYCERIN (NITROSTAT) 0.4 MG SL tablet Place 1 tablet (0.4 mg total) under the tongue every 5 (five) minutes as needed for chest pain. 06/08/19 04/01/21  Arnoldo Lenis, MD  rosuvastatin (CRESTOR) 5 MG tablet Take 5 mg by mouth daily. 03/10/21   [provider]  umeclidinium-vilanterol (ANORO ELLIPTA) 62.5-25 MCG/INH AEPB Inhale 1 puff into the lungs daily.    [provider]      Allergies    Ace inhibitors, Sulfa antibiotics, and Penicillins    Review of Systems   Review of Systems  Constitutional:  Positive for diaphoresis. Negative for activity change and fever.  HENT:  Negative for facial swelling and trouble swallowing.   Eyes:  Negative for discharge and redness.  Respiratory:  Positive for shortness of breath. Negative for cough.   Cardiovascular:  Positive for chest pain. Negative for palpitations.  Gastrointestinal:  Negative for abdominal pain and nausea.  Genitourinary:  Negative for dysuria and flank pain.  Musculoskeletal:  Negative for back pain and gait problem.  Skin:  Negative for pallor and rash.  Neurological:  Negative for syncope and headaches.   Physical Exam Updated Vital Signs BP (!) 134/93    Pulse (!) 105    Temp 97.8 F (36.6 C) (Oral)    Resp 17    Ht 5\' 3"  (1.6 m)    Wt 65.8 kg    SpO2 98%    BMI 25.69 kg/m  Physical Exam Vitals and nursing note reviewed.  Constitutional:      General: She is not in acute distress.    Appearance: Normal appearance. She  is well-developed.  HENT:     Head: Normocephalic and atraumatic.     Right Ear: External ear normal.     Left Ear: External ear normal.     Nose: Nose normal.     Mouth/Throat:     Mouth: Mucous membranes are moist.  Eyes:     General: No scleral icterus.       Right eye: No discharge.        Left eye: No discharge.     Extraocular Movements: Extraocular movements intact.     Pupils: Pupils are equal, round, and reactive to light.  Cardiovascular:     Rate and Rhythm: Tachycardia present. Rhythm irregular.  Pulses: Normal pulses.     Heart sounds: Normal heart sounds.  Pulmonary:     Effort: Pulmonary effort is normal. No respiratory distress.     Breath sounds: Decreased breath sounds present.  Abdominal:     General: Abdomen is flat.     Tenderness: There is no abdominal tenderness.  Musculoskeletal:        General: Normal range of motion.     Cervical back: Normal range of motion.     Right lower leg: No edema.     Left lower leg: No edema.  Skin:    General: Skin is warm and dry.     Capillary Refill: Capillary refill takes less than 2 seconds.  Neurological:     Mental Status: She is alert and oriented to person, place, and time.     GCS: GCS eye subscore is 4. GCS verbal subscore is 5. GCS motor subscore is 6.     Comments: Does not appear to be acutely intoxicated  Psychiatric:        Mood and Affect: Mood normal.        Behavior: Behavior normal.    ED Results / Procedures / Treatments   Labs (all labs ordered are listed, but only abnormal results are displayed) Labs Reviewed  CBC - Abnormal; Notable for the following components:      Result Value   Hemoglobin 15.9 (*)    HCT 46.6 (*)    All other components within normal limits  COMPREHENSIVE METABOLIC PANEL - Abnormal; Notable for the following components:   Sodium 131 (*)    Chloride 97 (*)    Glucose, Bld 147 (*)    ALT 75 (*)    All other components within normal limits  BRAIN NATRIURETIC  PEPTIDE - Abnormal; Notable for the following components:   B Natriuretic Peptide 378.0 (*)    All other components within normal limits  RESP PANEL BY RT-PCR (FLU A&B, COVID) ARPGX2  D-DIMER, QUANTITATIVE  MAGNESIUM  HEPATITIS PANEL, ACUTE  HEPARIN LEVEL (UNFRACTIONATED)  TROPONIN I (HIGH SENSITIVITY)  TROPONIN I (HIGH SENSITIVITY)    EKG EKG Interpretation  Date/Time:  Saturday April 11 2021 11:16:26 EST Ventricular Rate:  94 PR Interval:    QRS Duration: 114 QT Interval:  451 QTC Calculation: 558 R Axis:   243 Text Interpretation: Atrial flutter with predominant 3:1 AV block Paired ventricular premature complexes Left anterior fascicular block Low voltage, precordial leads Confirmed by Wynona Dove ([id number]) on 04/11/2021 11:40:18 AM  Radiology DG Chest Port 1 View  Result Date: 04/11/2021 CLINICAL DATA:  Chest pain EXAM: PORTABLE CHEST 1 VIEW COMPARISON:  Radiograph 02/09/2021 FINDINGS: Unchanged cardiomediastinal silhouette. No focal airspace consolidation. No large pleural effusion. No visible pneumothorax. Cervical spine fusion hardware noted. Bilateral shoulder osteoarthritis. Thoracic spondylosis. Fat containing left medial basilar hernia as seen on recent CT. IMPRESSION: No evidence of acute cardiopulmonary disease. Electronically Signed   By: Maurine Simmering M.D.   On: 04/11/2021 11:45    Procedures .Critical Care Performed by: Jeanell Sparrow, DO Authorized by: Jeanell Sparrow, DO   Critical care provider statement:    Critical care time (minutes):  75   Critical care time was exclusive of:  Separately billable procedures and treating other patients   Critical care was necessary to treat or prevent imminent or life-threatening deterioration of the following conditions:  Cardiac failure   Critical care was time spent personally by me on the following activities:  Development of treatment  plan with patient or surrogate, discussions with consultants, evaluation of patient's  response to treatment, examination of patient, ordering and review of laboratory studies, ordering and review of radiographic studies, ordering and performing treatments and interventions, pulse oximetry, re-evaluation of patient's condition, review of old charts and obtaining history from patient or surrogate   Care discussed with: admitting provider   Comments:     D/w consultant    Medications Ordered in ED Medications  diltiazem (CARDIZEM) 125 mg in dextrose 5% 125 mL (1 mg/mL) infusion (7.5 mg/hr Intravenous Rate/Dose Change 04/11/21 1554)  nicotine (NICODERM CQ - dosed in mg/24 hours) patch 21 mg (21 mg Transdermal Patch Applied 04/11/21 1522)  LORazepam (ATIVAN) tablet 1-4 mg (has no administration in time range)    Or  LORazepam (ATIVAN) injection 1-4 mg (has no administration in time range)  thiamine tablet 100 mg (100 mg Oral Given 04/11/21 1556)    Or  thiamine (B-1) injection 100 mg ( Intravenous See Alternative 4/48/18 5631)  folic acid (FOLVITE) tablet 1 mg (1 mg Oral Given 04/11/21 1556)  multivitamin with minerals tablet 1 tablet (1 tablet Oral Given 04/11/21 1556)  diazepam (VALIUM) tablet 5 mg (5 mg Oral Given 04/11/21 1556)  heparin bolus via infusion 4,000 Units (has no administration in time range)  heparin ADULT infusion 100 units/mL (25000 units/235mL) (has no administration in time range)  aspirin chewable tablet 324 mg (324 mg Oral Given 04/11/21 1131)  metoprolol tartrate (LOPRESSOR) tablet 25 mg (25 mg Oral Given 04/11/21 1230)  sodium chloride 0.9 % bolus 1,000 mL (0 mLs Intravenous Stopped 04/11/21 1330)  magnesium sulfate IVPB 2 g 50 mL (0 g Intravenous Stopped 04/11/21 1522)  metoprolol tartrate (LOPRESSOR) injection 5 mg (5 mg Intravenous Given 04/11/21 1415)    ED Course/ Medical Decision Making/ A&P                           Medical Decision Making Amount and/or Complexity of Data Reviewed External Data Reviewed: labs, radiology, ECG and notes. Labs: ordered.  Decision-making details documented in ED Course. Radiology: ordered. Decision-making details documented in ED Course. ECG/medicine tests: ordered and independent interpretation performed. Decision-making details documented in ED Course.  Risk OTC drugs. Prescription drug management. Decision regarding hospitalization. Risk Details: D/w consultant   Critical Care Total time providing critical care: 75-105 minutes   CC: Chest pain/pressure, elevated heart rate  This patient presents to the Emergency Department for the above complaint. This involves an extensive number of treatment options and is a complaint that carries with it a high risk of complications and morbidity. Vital signs were reviewed. Serious etiologies considered.  Record review:  Previous records obtained and reviewed   Medical and surgical history as noted above.   Work up as above, notable for:  Labs & imaging results that were available during my care of the patient were reviewed by me and considered in my medical decision making.   I ordered imaging studies which included chest x-ray and I reviewed imaging which showed no acute process  Cardiac monitoring reviewed and interpreted personally which shows atrial flutter with RVR  Social determinants of health include - N/a  Personally discussed patient care with consultant; Cardiology Dr Harl Bowie  Management: Give IV fluids, aspirin, metoprolol.  CHA2DS2-VASc score is 4.  Will restart anticoagulation  Patient remains in a flutter with rapid ventricular response.  We will start IV rate control.  Anticoagulation  Patient with no recent  echocardiogram (<12 mos) not currently anticoagulated.  Has been symptomatic for greater than 48 hours.  Intermittent atrial flutter.  Will not proceed with electrical cardioversion while in the emergency department  Discussed with cardiology, we will start patient on Cardizem infusion heparin infusion.  Cardiology does recommend  admission to medical service, cardiology to be on consult.   Patient with chronic alcohol use, last use was earlier today.  Placed on CIWA protocol.  Denies history of alcohol withdrawal.  No seizure.  Reassessment:  D/w patient, she is agreeable. Stable for admission.   Discussed with Dr. Denton Brick who accepts pt for admit.                    Counseled patient for approximately 4 minutes regarding smoking cessation. Discussed risks of smoking and how they applied and affected their visit here today. Patient not ready to quit at this time, however will follow up with their primary doctor when they are.   CPT code: [phone]: intermediate counseling for smoking cessation    This chart was dictated using voice recognition software.  Despite best efforts to proofread,  errors can occur which can change the documentation meaning.         Final Clinical Impression(s) / ED Diagnoses Final diagnoses:  Atrial flutter with rapid ventricular response ([place])  Tobacco abuse  Alcohol abuse    Rx / DC Orders ED Discharge Orders     None         Jeanell Sparrow, DO 04/11/21 1557

## 2021-04-12 ENCOUNTER — Observation Stay (HOSPITAL_BASED_OUTPATIENT_CLINIC_OR_DEPARTMENT_OTHER): Payer: Medicare Other

## 2021-04-12 ENCOUNTER — Other Ambulatory Visit (HOSPITAL_COMMUNITY): Payer: Self-pay | Admitting: *Deleted

## 2021-04-12 ENCOUNTER — Encounter (HOSPITAL_COMMUNITY): Payer: Self-pay | Admitting: Family Medicine

## 2021-04-12 DIAGNOSIS — I4892 Unspecified atrial flutter: Secondary | ICD-10-CM | POA: Diagnosis not present

## 2021-04-12 DIAGNOSIS — F17200 Nicotine dependence, unspecified, uncomplicated: Secondary | ICD-10-CM | POA: Diagnosis present

## 2021-04-12 DIAGNOSIS — I5030 Unspecified diastolic (congestive) heart failure: Secondary | ICD-10-CM | POA: Diagnosis present

## 2021-04-12 DIAGNOSIS — F1010 Alcohol abuse, uncomplicated: Secondary | ICD-10-CM | POA: Diagnosis not present

## 2021-04-12 DIAGNOSIS — I2510 Atherosclerotic heart disease of native coronary artery without angina pectoris: Secondary | ICD-10-CM | POA: Diagnosis present

## 2021-04-12 DIAGNOSIS — J449 Chronic obstructive pulmonary disease, unspecified: Secondary | ICD-10-CM | POA: Diagnosis present

## 2021-04-12 DIAGNOSIS — R9431 Abnormal electrocardiogram [ECG] [EKG]: Secondary | ICD-10-CM | POA: Diagnosis not present

## 2021-04-12 DIAGNOSIS — F418 Other specified anxiety disorders: Secondary | ICD-10-CM | POA: Diagnosis present

## 2021-04-12 DIAGNOSIS — I5032 Chronic diastolic (congestive) heart failure: Secondary | ICD-10-CM | POA: Diagnosis not present

## 2021-04-12 LAB — COMPREHENSIVE METABOLIC PANEL
ALT: 50 U/L — ABNORMAL HIGH (ref 0–44)
AST: 24 U/L (ref 15–41)
Albumin: 3.1 g/dL — ABNORMAL LOW (ref 3.5–5.0)
Alkaline Phosphatase: 63 U/L (ref 38–126)
Anion gap: 5 (ref 5–15)
BUN: 15 mg/dL (ref 8–23)
CO2: 22 mmol/L (ref 22–32)
Calcium: 8.3 mg/dL — ABNORMAL LOW (ref 8.9–10.3)
Chloride: 108 mmol/L (ref 98–111)
Creatinine, Ser: 0.56 mg/dL (ref 0.44–1.00)
GFR, Estimated: >60 mL/min (ref >60)
Glucose, Bld: 113 mg/dL — ABNORMAL HIGH (ref 70–99)
Potassium: 4 mmol/L (ref 3.5–5.1)
Sodium: 135 mmol/L (ref 135–145)
Total Bilirubin: 0.3 mg/dL (ref 0.3–1.2)
Total Protein: 5.5 g/dL — ABNORMAL LOW (ref 6.5–8.1)

## 2021-04-12 LAB — CBC
HCT: 39.2 % (ref 36.0–46.0)
Hemoglobin: 13.2 g/dL (ref 12.0–15.0)
MCH: 33.4 pg (ref 26.0–34.0)
MCHC: 33.7 g/dL (ref 30.0–36.0)
MCV: 99.2 fL (ref 80.0–100.0)
Platelets: 224 10*3/uL (ref 150–400)
RBC: 3.95 MIL/uL (ref 3.87–5.11)
RDW: 13.7 % (ref 11.5–15.5)
WBC: 5.8 10*3/uL (ref 4.0–10.5)
nRBC: 0 % (ref 0.0–0.2)

## 2021-04-12 LAB — MRSA NEXT GEN BY PCR, NASAL: MRSA by PCR Next Gen: NOT DETECTED

## 2021-04-12 LAB — ECHOCARDIOGRAM COMPLETE
Area-P 1/2: 3.17 cm2
Height: 63 in
S' Lateral: 3 cm
Weight: 2320 oz

## 2021-04-12 LAB — HIV ANTIBODY (ROUTINE TESTING W REFLEX): HIV Screen 4th Generation wRfx: NONREACTIVE

## 2021-04-12 LAB — HEPARIN LEVEL (UNFRACTIONATED)
Heparin Unfractionated: 0.23 IU/mL — ABNORMAL LOW (ref 0.30–0.70)
Heparin Unfractionated: 0.45 IU/mL (ref 0.30–0.70)

## 2021-04-12 MED ORDER — CHLORDIAZEPOXIDE HCL 5 MG PO CAPS
10.0000 mg | ORAL_CAPSULE | Freq: Three times a day (TID) | ORAL | Status: DC
  Administered 2021-04-12 – 2021-04-13 (×4): 10 mg via ORAL
  Filled 2021-04-12 (×4): qty 2

## 2021-04-12 NOTE — Assessment & Plan Note (Signed)
-  stable, denies SI/HI -Continue Cymbalta and Wellbutrin

## 2021-04-12 NOTE — Assessment & Plan Note (Addendum)
NST in 05/2019 showed ischemia with cath showing mild nonobstructive CAD -CTA chest on 04/07/2021 with signs of aortic atherosclerosis and three-vessel CAD, no PE -Patient's chest discomfort and dyspnea on exertion thought secondary to her presentation with Aflutter RVR -HS troponin of 4, repeat troponin at 4 are reassuring that there is no myocardial ischemia with current presentation -EKG consistent with atrial flutter with 3-1 AV block -Patient is ruled out for ACS by cardiac enzymes and EKG at this time given duration of her symptoms -stopped IV heparin -Continue Crestor and isosorbide and metoprolol -Pt has outpatient follow up with cardiology arranged.

## 2021-04-12 NOTE — Progress Notes (Signed)
*  PRELIMINARY RESULTS* Echocardiogram 2D Echocardiogram has been performed.  Paige Zimmerman 04/12/2021, 10:56 AM

## 2021-04-12 NOTE — Assessment & Plan Note (Signed)
Secondary to chronic alcohol abuse

## 2021-04-12 NOTE — Assessment & Plan Note (Addendum)
Pt admits to drinking multiple beers per day CIWA protocol ordered Librium 10 mg TID ordered to prevent withdrawal Pt was counseled on alcohol cessation and resources for treatment provided

## 2021-04-12 NOTE — Progress Notes (Signed)
Report given to [facility], LPN on Dept. 601. Pt to be transported via [place] to room 339.

## 2021-04-12 NOTE — Progress Notes (Signed)
PROGRESS NOTE   Paige Zimmerman  KCL:[id number] DOB: 1950/08/07 DOA: 04/11/2021 PCP: Caryl Bis, MD   Chief Complaint  Patient presents with   Shortness of Breath   Chest Pain   Level of care: Telemetry  Brief Admission History:  71 y.o. female with past medical history relevant for alcohol and tobacco abuse, osteopenia, depression/anxiety, history of  CAD (s/p NST in 05/2019 showing ischemia with cath showing mild nonobstructive CAD), HTN, COPD and paroxysmal atrial flutter/atrial fibrillation who presents to the ED with recurrent episodes of palpitations, dizziness, dyspnea on exertion and occasional chest discomfort at times with activity over the last several days - Patient has been following up with cardiology service for management of A-fib/atrial flutter with RVR, she was recently started on Eliquis, apparently she became bradycardic on metoprolol so metoprolol was reduced to 25 mg twice daily from 50 twice daily -Patient thought she was supposed to go off the Eliquis , so she has not taken Eliquis since 04/07/2021--- -She had a CTA chest on 04/07/2021 showing no PE however there were signs of aortic atherosclerosis and three-vessel CAD--- -Patient cardiology service on 04/10/2021 with above complaints and was advised to come to the ED for further evaluation -In the ED she is found to be in atrial flutter with RVR and 3-1 block -EDP discussed case with on-call cardiologist who recommends IV heparin and IV Cardizem drip - CBC largely WNL -Initial troponin is 4, repeat troponin at 4 -EKG consistent with atrial flutter with 3-1 AV block -Magnesium is 2.3, potassium is 4.0 sodium is 131 ALT 75, AST is 39 T. bili 0.3 -Flu and COVID-negative -D-dimer 0.43, BNP 378, chest x-ray without acute cardiopulmonary findings   Assessment and Plan: * Paroxysmal Afib/Atrial flutter with RVR- (present on admission) At this time rate is well controlled after IV diltiazem and now back on metoprolol  25 mg BID Will need to further observe telemetry as she has rapidly converted back to Aflutter RVR in past.  Felt not safe to discharge home today.  Observe tele for another 24 hours.  If remains stable ? DC 2/20   (HFpEF) heart failure with preserved ejection fraction ([place])- (present on admission) Does not appear to be volume overloaded at this time.  Echo reports grade III diastolic dysfunction (restrictive) She is being followed closely by [facility] ([facility])  Alcohol abuse- (present on admission) Pt admits to drinking multiple beers per day CIWA protocol ordered Librium 10 mg TID ordered  COPD (chronic obstructive pulmonary disease) ([place])- (present on admission) Bronchodilators as needed Encouraged tobacco cessation.   Elevated LFTs (ALT >> AST)- (present on admission) Secondary to chronic alcohol abuse  Depression with anxiety- (present on admission) -stable, denies SI/HI -Continue Cymbalta and Wellbutrin  Tobacco use disorder- (present on admission) Pt strongly advised to stop along with alcohol cessation which is going to be difficult Consult to [facility] for assistance with treatment options  CAD (coronary artery disease)- (present on admission) NST in 05/2019 showed ischemia with cath showing mild nonobstructive CAD -CTA chest on 04/07/2021 with signs of aortic atherosclerosis and three-vessel CAD, no PE -Patient's chest discomfort and dyspnea on exertion thought secondary to her presentation with Aflutter RVR -HS troponin of 4, repeat troponin at 4 are reassuring that there is no myocardial ischemia with current presentation -EKG consistent with atrial flutter with 3-1 AV block -Patient is ruled out for ACS by cardiac enzymes and EKG at this time given duration of her symptoms -Continue IV heparin -Continue  Crestor and isosorbide and metoprolol  HTN (hypertension)- (present on admission) Resume metoprolol 25 mg BID  DVT prophylaxis: IV heparin  Code Status:  full  Family Communication:  Disposition: Status is: Observation The patient will require care spanning > 2 midnights and should be moved to inpatient because: IV heparin infusion required   Consultants:   Procedures:   Antimicrobials:    Subjective: No withdrawal symptoms, no chest pain, no palpitations.  Objective: Vitals:   04/12/21 0900 04/12/21 1000 04/12/21 1100 04/12/21 1200  BP: 121/68 121/70 112/76 118/74  Pulse: (!) 59 62 (!) 57 68  Resp: 17 17 14 20   Temp:      TempSrc:      SpO2: 97% 100% 100% 97%  Weight:      Height:        Intake/Output Summary (Last 24 hours) at 04/12/2021 1318 Last data filed at 04/12/2021 1232 Gross per 24 hour  Intake 2686.93 ml  Output --  Net 2686.93 ml   Filed Weights   04/11/21 1059  Weight: 65.8 kg   Examination:  General exam: Appears calm and comfortable, awake alert, cooperative.  Respiratory system: Clear to auscultation. Respiratory effort normal. Cardiovascular system: normal S1 & S2 heard. Irregular, No JVD, murmurs, rubs, gallops or clicks. No pedal edema. Gastrointestinal system: Abdomen is nondistended, soft and nontender. No organomegaly or masses felt. Normal bowel sounds heard. Central nervous system: Alert and oriented. No focal neurological deficits. Extremities: Symmetric 5 x 5 power. Skin: No rashes, lesions or ulcers. Psychiatry: Judgement and insight appear normal. Mood & affect appropriate.   Data Reviewed: I have personally reviewed following labs and imaging studies  CBC: Recent Labs  Lab 04/11/21 1109 04/12/21 0615  WBC 6.5 5.8  HGB 15.9* 13.2  HCT 46.6* 39.2  MCV 97.1 99.2  PLT 282 779    Basic Metabolic Panel: Recent Labs  Lab 04/07/21 1558 04/11/21 1109 04/11/21 1203 04/12/21 0615  NA  --  131*  --  135  K  --  4.0  --  4.0  CL  --  97*  --  108  CO2  --  22  --  22  GLUCOSE  --  147*  --  113*  BUN  --  15  --  15  CREATININE 0.80 0.81  --  0.56  CALCIUM  --  9.0  --  8.3*   MG  --   --  2.3  --     CBG: No results for input(s): GLUCAP in the last 168 hours.  Recent Results (from the past 240 hour(s))  Resp Panel by RT-PCR (Flu A&B, Covid) Nasopharyngeal Swab     Status: None   Collection Time: 04/11/21 11:20 AM   Specimen: Nasopharyngeal Swab; Nasopharyngeal(NP) swabs in vial transport medium  Result Value Ref Range Status   SARS Coronavirus 2 by RT PCR NEGATIVE NEGATIVE Final    Comment: (NOTE) SARS-CoV-2 target nucleic acids are NOT DETECTED.  The SARS-CoV-2 RNA is generally detectable in upper respiratory specimens during the acute phase of infection. The lowest concentration of SARS-CoV-2 viral copies this assay can detect is 138 copies/mL. A negative result does not preclude SARS-Cov-2 infection and should not be used as the sole basis for treatment or other patient management decisions. A negative result may occur with  improper specimen collection/handling, submission of specimen other than nasopharyngeal swab, presence of viral mutation(s) within the areas targeted by this assay, and inadequate number of viral copies(<138 copies/mL). A negative  result must be combined with clinical observations, patient history, and epidemiological information. The expected result is Negative.  Fact Sheet for Patients:  EntrepreneurPulse.com.au  Fact Sheet for Healthcare Providers:  IncredibleEmployment.be  This test is no t yet approved or cleared by the [place] FDA and  has been authorized for detection and/or diagnosis of SARS-CoV-2 by FDA under an Emergency Use Authorization (EUA). This EUA will remain  in effect (meaning this test can be used) for the duration of the COVID-19 declaration under Section 564(b)(1) of the Act, 21 U.S.C.section 360bbb-3(b)(1), unless the authorization is terminated  or revoked sooner.       Influenza A by PCR NEGATIVE NEGATIVE Final   Influenza B by PCR NEGATIVE NEGATIVE  Final    Comment: (NOTE) The Xpert Xpress SARS-CoV-2/FLU/RSV plus assay is intended as an aid in the diagnosis of influenza from Nasopharyngeal swab specimens and should not be used as a sole basis for treatment. Nasal washings and aspirates are unacceptable for Xpert Xpress SARS-CoV-2/FLU/RSV testing.  Fact Sheet for Patients: EntrepreneurPulse.com.au  Fact Sheet for Healthcare Providers: IncredibleEmployment.be  This test is not yet approved or cleared by the [place] FDA and has been authorized for detection and/or diagnosis of SARS-CoV-2 by FDA under an Emergency Use Authorization (EUA). This EUA will remain in effect (meaning this test can be used) for the duration of the COVID-19 declaration under Section 564(b)(1) of the Act, 21 U.S.C. section 360bbb-3(b)(1), unless the authorization is terminated or revoked.  Performed at [facility], [address], [place], [place] [address]   MRSA Next Gen by PCR, Nasal     Status: None   Collection Time: 04/11/21  8:30 PM   Specimen: Nasal Mucosa; Nasal Swab  Result Value Ref Range Status   MRSA by PCR Next Gen NOT DETECTED NOT DETECTED Final    Comment: (NOTE) The GeneXpert MRSA Assay (FDA approved for NASAL specimens only), is one component of a comprehensive MRSA colonization surveillance program. It is not intended to diagnose MRSA infection nor to guide or monitor treatment for MRSA infections. Test performance is not FDA approved in patients less than 39 years old. Performed at [facility], [address] [place], [place] [address]      Radiology Studies: [facility] 1 View  Result Date: 04/11/2021 CLINICAL DATA:  Chest pain EXAM: PORTABLE CHEST 1 VIEW COMPARISON:  Radiograph 02/09/2021 FINDINGS: Unchanged cardiomediastinal silhouette. No focal airspace consolidation. No large pleural effusion. No visible pneumothorax. Cervical spine fusion hardware noted. Bilateral shoulder  osteoarthritis. Thoracic spondylosis. Fat containing left medial basilar hernia as seen on recent CT. IMPRESSION: No evidence of acute cardiopulmonary disease. Electronically Signed   By: Maurine Simmering M.D.   On: 04/11/2021 11:45   ECHOCARDIOGRAM COMPLETE  Result Date: 04/12/2021    ECHOCARDIOGRAM REPORT   Patient Name:   DONYE DAUENHAUER [facility] Date of Exam: 04/12/2021 Medical Rec #:  [id number]      Height:       63.0 in Accession #:    [id number]     Weight:       145.0 lb Date of Birth:  1950-07-07      BSA:          1.687 m Patient Age:    34 years       BP:           121/70 mmHg Patient Gender: F              HR:  63 bpm. Exam Location:  [facility] Procedure: 2D Echo, Cardiac Doppler and Color Doppler Indications:    Abnormal ECG R94.31  History:        Patient has no prior history of Echocardiogram examinations.                 CAD, COPD; Risk Factors:Hypertension and Current Smoker.                 Paroxysmal Afib/Atrial flutter with RVR, Alcohol abuse.  Sonographer:    Alvino Chapel RCS Referring Phys: [phone] COURAGE EMOKPAE IMPRESSIONS  1. Left ventricular ejection fraction, by estimation, is 55 to 60%. The left ventricle has normal function. The left ventricle has no regional wall motion abnormalities. Left ventricular diastolic parameters are consistent with Grade III diastolic dysfunction (restrictive).  2. Right ventricular systolic function is normal. The right ventricular size is normal. There is normal pulmonary artery systolic pressure.  3. Left atrial size was moderately dilated.  4. Right atrial size was severely dilated.  5. The mitral valve is normal in structure. Mild mitral valve regurgitation. No evidence of mitral stenosis.  6. The aortic valve is tricuspid. Aortic valve regurgitation is not visualized. No aortic stenosis is present.  7. The inferior vena cava is normal in size with greater than 50% respiratory variability, suggesting right atrial pressure of 3 mmHg. Comparison(s): No prior  Echocardiogram. FINDINGS  Left Ventricle: Left ventricular ejection fraction, by estimation, is 55 to 60%. The left ventricle has normal function. The left ventricle has no regional wall motion abnormalities. The left ventricular internal cavity size was normal in size. There is  no left ventricular hypertrophy. Left ventricular diastolic parameters are consistent with Grade III diastolic dysfunction (restrictive). Right Ventricle: The right ventricular size is normal. No increase in right ventricular wall thickness. Right ventricular systolic function is normal. There is normal pulmonary artery systolic pressure. The tricuspid regurgitant velocity is 1.71 m/s, and  with an assumed right atrial pressure of 3 mmHg, the estimated right ventricular systolic pressure is 04.5 mmHg. Left Atrium: Left atrial size was moderately dilated. Right Atrium: Right atrial size was severely dilated. Pericardium: There is no evidence of pericardial effusion. Mitral Valve: The mitral valve is normal in structure. Mild mitral valve regurgitation. No evidence of mitral valve stenosis. Tricuspid Valve: The tricuspid valve is normal in structure. Tricuspid valve regurgitation is mild . No evidence of tricuspid stenosis. Aortic Valve: The aortic valve is tricuspid. Aortic valve regurgitation is not visualized. No aortic stenosis is present. Pulmonic Valve: The pulmonic valve was grossly normal. Pulmonic valve regurgitation is not visualized. No evidence of pulmonic stenosis. Aorta: The aortic root is normal in size and structure. Venous: The inferior vena cava is normal in size with greater than 50% respiratory variability, suggesting right atrial pressure of 3 mmHg. IAS/Shunts: No atrial level shunt detected by color flow Doppler.  LEFT VENTRICLE PLAX 2D LVIDd:         5.10 cm   Diastology LVIDs:         3.00 cm   LV e' medial:    6.64 cm/s LV PW:         1.10 cm   LV E/e' medial:  13.5 LV IVS:        0.90 cm   LV e' lateral:   9.03 cm/s  LVOT diam:     2.00 cm   LV E/e' lateral: 9.9 LV SV:         68 LV  SV Index:   40 LVOT Area:     3.14 cm  RIGHT VENTRICLE RV S prime:     11.40 cm/s TAPSE (M-mode): 2.0 cm LEFT ATRIUM             Index        RIGHT ATRIUM           Index LA diam:        3.30 cm 1.96 cm/m   RA Area:     24.80 cm LA Vol (A2C):   62.6 ml 37.12 ml/m  RA Volume:   87.30 ml  51.76 ml/m LA Vol (A4C):   62.1 ml 36.82 ml/m LA Biplane Vol: 67.7 ml 40.14 ml/m  AORTIC VALVE LVOT Vmax:   92.90 cm/s LVOT Vmean:  67.500 cm/s LVOT VTI:    0.217 m  AORTA Ao Root diam: 3.60 cm MITRAL VALVE               TRICUSPID VALVE MV Area (PHT): 3.17 cm    TR Peak grad:   11.7 mmHg MV Decel Time: 239 msec    TR Vmax:        171.00 cm/s MV E velocity: 89.80 cm/s MV A velocity: 36.80 cm/s  SHUNTS MV E/A ratio:  2.44        Systemic VTI:  0.22 m                            Systemic Diam: 2.00 cm Rudean Haskell MD Electronically signed by Rudean Haskell MD Signature Date/Time: 04/12/2021/12:54:21 PM    Final     Scheduled Meds:  buPROPion  150 mg Oral BID   chlordiazePOXIDE  10 mg Oral TID   Chlorhexidine Gluconate Cloth  6 each Topical Daily   DULoxetine  60 mg Oral BID   folic acid  1 mg Oral Daily   gabapentin  300 mg Oral BID   isosorbide mononitrate  15 mg Oral Daily   metoprolol tartrate  25 mg Oral BID   multivitamin with minerals  1 tablet Oral Daily   nicotine  21 mg Transdermal Daily   rosuvastatin  5 mg Oral Daily   sodium chloride flush  3 mL Intravenous Q12H   sodium chloride flush  3 mL Intravenous Q12H   thiamine  100 mg Oral Daily   Or   thiamine  100 mg Intravenous Daily   umeclidinium-vilanterol  1 puff Inhalation Daily   Continuous Infusions:  sodium chloride     heparin 1,050 Units/hr (04/12/21 1255)    LOS: 0 days   Time spent: 36 mins  Finnian Husted Wynetta Emery, MD How to contact the [facility] Attending or Consulting provider [place] or covering provider during after hours Page, for this patient?  Check the  care team in [facility] and look for a) attending/consulting [facility] provider listed and b) the [facility] team listed Log into [email or web address] and use [place]'s universal password to access. If you do not have the password, please contact the hospital operator. Locate the [facility] provider you are looking for under [facility] and page to a number that you can be directly reached. If you still have difficulty reaching the provider, please page the [facility] (Director on Call) for the Hospitalists listed on amion for assistance.  04/12/2021, 1:18 PM

## 2021-04-12 NOTE — Progress Notes (Signed)
ANTICOAGULATION CONSULT NOTE - Follow Up Consult  Pharmacy Consult for heparin Indication: atrial fibrillation  Labs: Recent Labs    04/11/21 1109 04/11/21 1331 04/11/21 2313 04/12/21 0615  HGB 15.9*  --   --  13.2  HCT 46.6*  --   --  39.2  PLT 282  --   --  224  HEPARINUNFRC  --   --  0.23* 0.45  CREATININE 0.81  --   --  0.56  TROPONINIHS 4 4  --   --     Assessment: 70yo female subtherapeutic on heparin with initial dosing for Afib; no infusion issues or signs of bleeding per RN. Heparin level therapeutic.  Goal of Therapy:  Heparin level 0.3-0.7 units/ml   Plan:  Will continue Heparin at 1050 units/hr and check level daily   Hart Robinsons, PharmD 04/12/2021,9:03 AM

## 2021-04-12 NOTE — Care Management CC44 (Signed)
Condition Code 44 Documentation Completed  Patient Details  Name: Paige Zimmerman MRN: [id number] Date of Birth: 22-Apr-1950   Condition Code 44 given:  Yes Patient signature on Condition Code 44 notice:  Yes Documentation of 2 MD's agreement:  Yes Code 44 added to claim:  Yes    Carles Collet, RN 04/12/2021, 2:04 PM

## 2021-04-12 NOTE — Progress Notes (Signed)
Patient converted to sinus brady at 0132 from aflutter. HR in high 40s low 50s. Cardizem drip lowered to 2.5. MD aware.

## 2021-04-12 NOTE — Progress Notes (Signed)
ANTICOAGULATION CONSULT NOTE - Follow Up Consult  Pharmacy Consult for heparin Indication: atrial fibrillation  Labs: Recent Labs    04/11/21 1109 04/11/21 1331 04/11/21 2313  HGB 15.9*  --   --   HCT 46.6*  --   --   PLT 282  --   --   HEPARINUNFRC  --   --  0.23*  CREATININE 0.81  --   --   TROPONINIHS 4 4  --     Assessment: 71yo female subtherapeutic on heparin with initial dosing for Afib; no infusion issues or signs of bleeding per RN.  Goal of Therapy:  Heparin level 0.3-0.7 units/ml   Plan:  Will increase heparin infusion by 2 units/kg/hr to 1050 units/hr and check level in 6 hours.    Wynona Neat, PharmD, BCPS  04/12/2021,12:25 AM

## 2021-04-12 NOTE — Hospital Course (Addendum)
71 y.o. female with past medical history relevant for alcohol and tobacco abuse, osteopenia, depression/anxiety, history of  CAD (s/p NST in 05/2019 showing ischemia with cath showing mild nonobstructive CAD), HTN, COPD and paroxysmal atrial flutter/atrial fibrillation who presents to the ED with recurrent episodes of palpitations, dizziness, dyspnea on exertion and occasional chest discomfort at times with activity over the last several days - Patient has been following up with cardiology service for management of A-fib/atrial flutter with RVR, she was recently started on Eliquis, apparently she became bradycardic on metoprolol so metoprolol was reduced to 25 mg twice daily from 50 twice daily -Patient thought she was supposed to go off the Eliquis , so she has not taken Eliquis since 04/07/2021--- -She had a CTA chest on 04/07/2021 showing no PE however there were signs of aortic atherosclerosis and three-vessel CAD--- -Patient cardiology service on 04/10/2021 with above complaints and was advised to come to the ED for further evaluation -In the ED she is found to be in atrial flutter with RVR and 3-1 block -EDP discussed case with on-call cardiologist who recommends IV heparin and IV Cardizem drip - CBC largely WNL -Initial troponin is 4, repeat troponin at 4 -EKG consistent with atrial flutter with 3-1 AV block -Magnesium is 2.3, potassium is 4.0 sodium is 131 ALT 75, AST is 39 T. bili 0.3 -Flu and COVID-negative -D-dimer 0.43, BNP 378, chest x-ray without acute cardiopulmonary findings  04/13/2021: pt now off IV cardizem and back on oral metoprolol and is SR.  Cardiology saw today and increased metoprolol to 37.5 mg BID, patient back on apixaban.  Per cardiology ok to DC home today.

## 2021-04-12 NOTE — Assessment & Plan Note (Signed)
At this time rate is well controlled after IV diltiazem and now back on metoprolol 25 mg BID Will need to further observe telemetry as she has rapidly converted back to Aflutter RVR in past.  Felt not safe to discharge home today.  Observe tele for another 24 hours.  If remains stable ? DC 2/20

## 2021-04-12 NOTE — Assessment & Plan Note (Addendum)
Pt strongly advised to stop along with alcohol cessation which is going to be difficult Consult to [facility] for assistance with treatment options

## 2021-04-12 NOTE — Assessment & Plan Note (Signed)
Bronchodilators as needed Encouraged tobacco cessation.

## 2021-04-12 NOTE — Care Management Obs Status (Signed)
[facility] NOTIFICATION   Patient Details  Name: Paige Zimmerman MRN: [id number] Date of Birth: [birth year]   Medicare Observation Status Notification Given:  Yes    Carles Collet, RN 04/12/2021, 2:03 PM

## 2021-04-12 NOTE — Assessment & Plan Note (Addendum)
Resume metoprolol, now increased to 37.5 mg BID

## 2021-04-12 NOTE — Assessment & Plan Note (Signed)
Does not appear to be volume overloaded at this time.  Echo reports grade III diastolic dysfunction (restrictive) She is being followed closely by [facility] ([facility])

## 2021-04-13 ENCOUNTER — Encounter: Payer: Self-pay | Admitting: Cardiology

## 2021-04-13 DIAGNOSIS — I4892 Unspecified atrial flutter: Secondary | ICD-10-CM

## 2021-04-13 DIAGNOSIS — F1010 Alcohol abuse, uncomplicated: Secondary | ICD-10-CM | POA: Diagnosis not present

## 2021-04-13 DIAGNOSIS — F17210 Nicotine dependence, cigarettes, uncomplicated: Secondary | ICD-10-CM | POA: Diagnosis not present

## 2021-04-13 DIAGNOSIS — R7989 Other specified abnormal findings of blood chemistry: Secondary | ICD-10-CM | POA: Diagnosis not present

## 2021-04-13 DIAGNOSIS — I5032 Chronic diastolic (congestive) heart failure: Secondary | ICD-10-CM | POA: Diagnosis not present

## 2021-04-13 DIAGNOSIS — I10 Essential (primary) hypertension: Secondary | ICD-10-CM | POA: Diagnosis not present

## 2021-04-13 LAB — CBC
HCT: 37.6 % (ref 36.0–46.0)
Hemoglobin: 12.4 g/dL (ref 12.0–15.0)
MCH: 33.2 pg (ref 26.0–34.0)
MCHC: 33 g/dL (ref 30.0–36.0)
MCV: 100.8 fL — ABNORMAL HIGH (ref 80.0–100.0)
Platelets: 232 10*3/uL (ref 150–400)
RBC: 3.73 MIL/uL — ABNORMAL LOW (ref 3.87–5.11)
RDW: 13.9 % (ref 11.5–15.5)
WBC: 5.3 10*3/uL (ref 4.0–10.5)
nRBC: 0 % (ref 0.0–0.2)

## 2021-04-13 LAB — COMPREHENSIVE METABOLIC PANEL
ALT: 41 U/L (ref 0–44)
AST: 20 U/L (ref 15–41)
Albumin: 3.3 g/dL — ABNORMAL LOW (ref 3.5–5.0)
Alkaline Phosphatase: 58 U/L (ref 38–126)
Anion gap: 6 (ref 5–15)
BUN: 13 mg/dL (ref 8–23)
CO2: 24 mmol/L (ref 22–32)
Calcium: 8.8 mg/dL — ABNORMAL LOW (ref 8.9–10.3)
Chloride: 106 mmol/L (ref 98–111)
Creatinine, Ser: 0.64 mg/dL (ref 0.44–1.00)
GFR, Estimated: >60 mL/min (ref >60)
Glucose, Bld: 99 mg/dL (ref 70–99)
Potassium: 4.2 mmol/L (ref 3.5–5.1)
Sodium: 136 mmol/L (ref 135–145)
Total Bilirubin: 0.5 mg/dL (ref 0.3–1.2)
Total Protein: 5.4 g/dL — ABNORMAL LOW (ref 6.5–8.1)

## 2021-04-13 LAB — HEPARIN LEVEL (UNFRACTIONATED): Heparin Unfractionated: 0.65 IU/mL (ref 0.30–0.70)

## 2021-04-13 LAB — MAGNESIUM: Magnesium: 2.2 mg/dL (ref 1.7–2.4)

## 2021-04-13 MED ORDER — METOPROLOL TARTRATE 25 MG PO TABS: 37.5000 mg | ORAL_TABLET | Freq: Two times a day (BID) | ORAL | Status: DC

## 2021-04-13 MED ORDER — METOPROLOL TARTRATE 25 MG PO TABS: 37.5000 mg | ORAL_TABLET | Freq: Two times a day (BID) | ORAL | 3 refills | Status: AC

## 2021-04-13 MED ORDER — ADULT MULTIVITAMIN W/MINERALS CH: 1.0000 | ORAL_TABLET | Freq: Every day | ORAL | Status: AC

## 2021-04-13 MED ORDER — GUAIFENESIN-DM 100-10 MG/5ML PO SYRP
5.0000 mL | ORAL_SOLUTION | ORAL | Status: DC | PRN
Start: 2021-04-13 — End: 2021-04-13
  Administered 2021-04-13: 5 mL via ORAL
  Filled 2021-04-13: qty 5

## 2021-04-13 MED ORDER — THIAMINE HCL 100 MG PO TABS: 100.0000 mg | ORAL_TABLET | Freq: Every day | ORAL | 1 refills | Status: AC

## 2021-04-13 MED ORDER — FOLIC ACID 1 MG PO TABS
1.0000 mg | ORAL_TABLET | Freq: Every day | ORAL | 1 refills | Status: AC
Start: 2021-04-14 — End: ?

## 2021-04-13 MED ORDER — APIXABAN 5 MG PO TABS
5.0000 mg | ORAL_TABLET | Freq: Two times a day (BID) | ORAL | Status: DC
  Administered 2021-04-13: 5 mg via ORAL
  Filled 2021-04-13: qty 1

## 2021-04-13 NOTE — Discharge Summary (Signed)
Physician Discharge Summary   Patient: Paige Zimmerman MRN: [id number] DOB: 18-Oct-1950  Admit date:     04/11/2021  Discharge date: 04/13/21  Discharge Physician: Irwin Brakeman   PCP: Caryl Bis, MD   Recommendations at discharge:   Follow up with cardiology EP in 1-2 weeks as scheduled.  Follow up with PCP in 1 week Avoid all alcohol consumption  Discharge Diagnoses: Principal Problem:   Paroxysmal Afib/Atrial flutter with RVR Active Problems:   Alcohol abuse   (HFpEF) heart failure with preserved ejection fraction (HCC)   Elevated LFTs (ALT >> AST)   COPD (chronic obstructive pulmonary disease) (HCC)   Depression with anxiety   HTN (hypertension)   CAD (coronary artery disease)   Tobacco use disorder   Atrial flutter with rapid ventricular response ([place])  Resolved Problems:   * No resolved hospital problems. *  Hospital Course: 71 y.o. female with past medical history relevant for alcohol and tobacco abuse, osteopenia, depression/anxiety, history of  CAD (s/p NST in 05/2019 showing ischemia with cath showing mild nonobstructive CAD), HTN, COPD and paroxysmal atrial flutter/atrial fibrillation who presents to the ED with recurrent episodes of palpitations, dizziness, dyspnea on exertion and occasional chest discomfort at times with activity over the last several days - Patient has been following up with cardiology service for management of A-fib/atrial flutter with RVR, she was recently started on Eliquis, apparently she became bradycardic on metoprolol so metoprolol was reduced to 25 mg twice daily from 50 twice daily -Patient thought she was supposed to go off the Eliquis , so she has not taken Eliquis since 04/07/2021--- -She had a CTA chest on 04/07/2021 showing no PE however there were signs of aortic atherosclerosis and three-vessel CAD--- -Patient cardiology service on 04/10/2021 with above complaints and was advised to come to the ED for further evaluation -In the  ED she is found to be in atrial flutter with RVR and 3-1 block -EDP discussed case with on-call cardiologist who recommends IV heparin and IV Cardizem drip - CBC largely WNL -Initial troponin is 4, repeat troponin at 4 -EKG consistent with atrial flutter with 3-1 AV block -Magnesium is 2.3, potassium is 4.0 sodium is 131 ALT 75, AST is 39 T. bili 0.3 -Flu and COVID-negative -D-dimer 0.43, BNP 378, chest x-ray without acute cardiopulmonary findings  04/13/2021: pt now off IV cardizem and back on oral metoprolol and is SR.  Cardiology saw today and increased metoprolol to 37.5 mg BID, patient back on apixaban.  Per cardiology ok to DC home today.    Assessment and Plan: * Paroxysmal Afib/Atrial flutter with RVR- (present on admission) At this time rate is well controlled after IV diltiazem and now back on metoprolol 25 mg BID Will need to further observe telemetry as she has rapidly converted back to Aflutter RVR in past.  Felt not safe to discharge home today.  Observe tele for another 24 hours.  If remains stable ? DC 2/20   (HFpEF) heart failure with preserved ejection fraction ([place])- (present on admission) Does not appear to be volume overloaded at this time.  Echo reports grade III diastolic dysfunction (restrictive) She is being followed closely by [facility] ([facility])  Alcohol abuse- (present on admission) Pt admits to drinking multiple beers per day CIWA protocol ordered Librium 10 mg TID ordered to prevent withdrawal Pt was counseled on alcohol cessation and resources for treatment provided  COPD (chronic obstructive pulmonary disease) ([place])- (present on admission) Bronchodilators as needed Encouraged tobacco  cessation.   Elevated LFTs (ALT >> AST)- (present on admission) Secondary to chronic alcohol abuse  Depression with anxiety- (present on admission) -stable, denies SI/HI -Continue Cymbalta and Wellbutrin  Atrial flutter with rapid ventricular response  ([place])- (present on admission) Pt responded well to IV diltiazem and restarted on metoprolol 25 mg BID Pt now back in SR.  Cardiology has seen patient and recommended increasing to 37.5 mg BID, with an order to take an extra 25 mg tab ONLY if needed for palpitations Close outpatient follow up with cardiology has been arranged by them.  Ok to DC home today per Dr Harl Bowie.   Tobacco use disorder- (present on admission) Pt strongly advised to stop along with alcohol cessation which is going to be difficult Consult to [facility] for assistance with treatment options  CAD (coronary artery disease)- (present on admission) NST in 05/2019 showed ischemia with cath showing mild nonobstructive CAD -CTA chest on 04/07/2021 with signs of aortic atherosclerosis and three-vessel CAD, no PE -Patient's chest discomfort and dyspnea on exertion thought secondary to her presentation with Aflutter RVR -HS troponin of 4, repeat troponin at 4 are reassuring that there is no myocardial ischemia with current presentation -EKG consistent with atrial flutter with 3-1 AV block -Patient is ruled out for ACS by cardiac enzymes and EKG at this time given duration of her symptoms -stopped IV heparin -Continue Crestor and isosorbide and metoprolol -Pt has outpatient follow up with cardiology arranged.   HTN (hypertension)- (present on admission) Resume metoprolol, now increased to 37.5 mg BID       Consultants: cardiology  Procedures performed:   Disposition: Home Diet recommendation:  Cardiac diet  DISCHARGE MEDICATION: Allergies as of 04/13/2021       Reactions   Ace Inhibitors Cough   Sulfa Antibiotics Hives   Penicillins Rash        Medication List     STOP taking these medications    acetaminophen 500 MG tablet Commonly known as: TYLENOL       TAKE these medications    albuterol 108 (90 Base) MCG/ACT inhaler Commonly known as: VENTOLIN HFA Inhale 1-2 puffs into the lungs every 6 (six) hours as  needed for wheezing or shortness of breath.   Anoro Ellipta 62.5-25 MCG/ACT Aepb Generic drug: umeclidinium-vilanterol Inhale 1 puff into the lungs daily.   apixaban 5 MG Tabs tablet Commonly known as: ELIQUIS Take 1 tablet (5 mg total) by mouth 2 (two) times daily.   buPROPion 150 MG 12 hr tablet Commonly known as: WELLBUTRIN SR Take 150 mg by mouth 2 (two) times daily.   DULoxetine 60 MG capsule Commonly known as: CYMBALTA Take 60 mg by mouth 2 (two) times daily.   folic acid 1 MG tablet Commonly known as: FOLVITE Take 1 tablet (1 mg total) by mouth daily. Start taking on: April 14, 2021   gabapentin 300 MG capsule Commonly known as: NEURONTIN Take 300 mg by mouth 2 (two) times daily.   isosorbide mononitrate 30 MG 24 hr tablet Commonly known as: IMDUR Take 0.5 tablets (15 mg total) by mouth daily.   metoprolol tartrate 25 MG tablet Commonly known as: LOPRESSOR Take 1.5 tablets (37.5 mg total) by mouth 2 (two) times daily. Take an extra 25 mg tablet ONLY as needed for palpitations. What changed:  how much to take additional instructions   multivitamin with minerals Tabs tablet Take 1 tablet by mouth daily. Start taking on: April 14, 2021   nitroGLYCERIN 0.4 MG SL tablet  Commonly known as: NITROSTAT Place 1 tablet (0.4 mg total) under the tongue every 5 (five) minutes as needed for chest pain.   rosuvastatin 5 MG tablet Commonly known as: CRESTOR Take 5 mg by mouth daily.   thiamine 100 MG tablet Take 1 tablet (100 mg total) by mouth daily. Start taking on: April 14, 2021       Discharge Exam: Danley Danker Weights   04/11/21 1059 04/13/21 0617  Weight: 65.8 kg 68.6 kg   General exam: Appears calm and comfortable, awake alert, cooperative.  Respiratory system: Clear to auscultation. Respiratory effort normal. Cardiovascular system: normal S1 & S2 heard. Irregular, No JVD, murmurs, rubs, gallops or clicks. No pedal edema. Gastrointestinal system:  Abdomen is nondistended, soft and nontender. No organomegaly or masses felt. Normal bowel sounds heard. Central nervous system: Alert and oriented. No focal neurological deficits. Extremities: Symmetric 5 x 5 power. Skin: No rashes, lesions or ulcers. Psychiatry: Judgement and insight appear normal. Mood & affect appropriate.   Condition at discharge: stable  The results of significant diagnostics from this hospitalization (including imaging, microbiology, ancillary and laboratory) are listed below for reference.   Imaging Studies: CT Angio Chest Pulmonary Embolism (PE) W or WO Contrast  Result Date: 04/07/2021 CLINICAL DATA:  Dyspnea on exertion in a 71 year old female. EXAM: CT ANGIOGRAPHY CHEST WITH CONTRAST TECHNIQUE: Multidetector CT imaging of the chest was performed using the standard protocol during bolus administration of intravenous contrast. Multiplanar CT image reconstructions and MIPs were obtained to evaluate the vascular anatomy. RADIATION DOSE REDUCTION: This exam was performed according to the departmental dose-optimization program which includes automated exposure control, adjustment of the mA and/or kV according to patient size and/or use of iterative reconstruction technique. CONTRAST:  97mL OMNIPAQUE IOHEXOL 350 MG/ML SOLN COMPARISON:  March 19, 2021. FINDINGS: Cardiovascular: Calcified atheromatous plaque in the thoracic aorta, no aneurysmal dilation. Smooth contour of the thoracic aorta without adjacent stranding. Aorta is not well evaluated. Heart size is normal. Signs of three-vessel coronary artery disease. No pericardial effusion. The Central pulmonary vasculature opacified greater than 600 Hounsfield units. No evidence of pulmonary embolism with evaluation to the subsegmental level based on exam quality. Mediastinum/Nodes: No thoracic inlet, axillary, mediastinal or hilar adenopathy. Esophagus grossly normal. Lungs/Pleura: Granulomatous changes in the RIGHT lower and RIGHT  upper lobe with signs of calcified granulomata. No effusion. No consolidative changes. Upper Abdomen: No acute upper abdominal process to the extent evaluated. Musculoskeletal: No acute bone finding. No destructive bone process. Spinal degenerative changes. Signs of cervical spinal fusion which are incompletely evaluated. Review of the MIP images confirms the above findings. IMPRESSION: 1. No evidence of pulmonary embolism. 2. Signs of three-vessel coronary artery disease. 3. Aortic atherosclerosis Aortic Atherosclerosis (ICD10-I70.0). Electronically Signed   By: Zetta Bills M.D.   On: 04/07/2021 16:21   DG Chest Port 1 View  Result Date: 04/11/2021 CLINICAL DATA:  Chest pain EXAM: PORTABLE CHEST 1 VIEW COMPARISON:  Radiograph 02/09/2021 FINDINGS: Unchanged cardiomediastinal silhouette. No focal airspace consolidation. No large pleural effusion. No visible pneumothorax. Cervical spine fusion hardware noted. Bilateral shoulder osteoarthritis. Thoracic spondylosis. Fat containing left medial basilar hernia as seen on recent CT. IMPRESSION: No evidence of acute cardiopulmonary disease. Electronically Signed   By: Maurine Simmering M.D.   On: 04/11/2021 11:45   ECHOCARDIOGRAM COMPLETE  Result Date: 04/12/2021    ECHOCARDIOGRAM REPORT   Patient Name:   Paige Zimmerman [facility] Date of Exam: 04/12/2021 Medical Rec #:  [id number]      Height:  63.0 in Accession #:    [id number]     Weight:       145.0 lb Date of Birth:  [birth year]      BSA:          1.687 m Patient Age:    12 years       BP:           121/70 mmHg Patient Gender: F              HR:           63 bpm. Exam Location:  [facility] Procedure: 2D Echo, Cardiac Doppler and Color Doppler Indications:    Abnormal ECG R94.31  History:        Patient has no prior history of Echocardiogram examinations.                 CAD, COPD; Risk Factors:Hypertension and Current Smoker.                 Paroxysmal Afib/Atrial flutter with RVR, Alcohol abuse.  Sonographer:    Alvino Chapel RCS Referring Phys: [phone] COURAGE EMOKPAE IMPRESSIONS  1. Left ventricular ejection fraction, by estimation, is 55 to 60%. The left ventricle has normal function. The left ventricle has no regional wall motion abnormalities. Left ventricular diastolic parameters are consistent with Grade III diastolic dysfunction (restrictive).  2. Right ventricular systolic function is normal. The right ventricular size is normal. There is normal pulmonary artery systolic pressure.  3. Left atrial size was moderately dilated.  4. Right atrial size was severely dilated.  5. The mitral valve is normal in structure. Mild mitral valve regurgitation. No evidence of mitral stenosis.  6. The aortic valve is tricuspid. Aortic valve regurgitation is not visualized. No aortic stenosis is present.  7. The inferior vena cava is normal in size with greater than 50% respiratory variability, suggesting right atrial pressure of 3 mmHg. Comparison(s): No prior Echocardiogram. FINDINGS  Left Ventricle: Left ventricular ejection fraction, by estimation, is 55 to 60%. The left ventricle has normal function. The left ventricle has no regional wall motion abnormalities. The left ventricular internal cavity size was normal in size. There is  no left ventricular hypertrophy. Left ventricular diastolic parameters are consistent with Grade III diastolic dysfunction (restrictive). Right Ventricle: The right ventricular size is normal. No increase in right ventricular wall thickness. Right ventricular systolic function is normal. There is normal pulmonary artery systolic pressure. The tricuspid regurgitant velocity is 1.71 m/s, and  with an assumed right atrial pressure of 3 mmHg, the estimated right ventricular systolic pressure is 62.5 mmHg. Left Atrium: Left atrial size was moderately dilated. Right Atrium: Right atrial size was severely dilated. Pericardium: There is no evidence of pericardial effusion. Mitral Valve: The mitral valve is normal in  structure. Mild mitral valve regurgitation. No evidence of mitral valve stenosis. Tricuspid Valve: The tricuspid valve is normal in structure. Tricuspid valve regurgitation is mild . No evidence of tricuspid stenosis. Aortic Valve: The aortic valve is tricuspid. Aortic valve regurgitation is not visualized. No aortic stenosis is present. Pulmonic Valve: The pulmonic valve was grossly normal. Pulmonic valve regurgitation is not visualized. No evidence of pulmonic stenosis. Aorta: The aortic root is normal in size and structure. Venous: The inferior vena cava is normal in size with greater than 50% respiratory variability, suggesting right atrial pressure of 3 mmHg. IAS/Shunts: No atrial level shunt detected by color flow Doppler.  LEFT VENTRICLE PLAX 2D LVIDd:  5.10 cm   Diastology LVIDs:         3.00 cm   LV e' medial:    6.64 cm/s LV PW:         1.10 cm   LV E/e' medial:  13.5 LV IVS:        0.90 cm   LV e' lateral:   9.03 cm/s LVOT diam:     2.00 cm   LV E/e' lateral: 9.9 LV SV:         68 LV SV Index:   40 LVOT Area:     3.14 cm  RIGHT VENTRICLE RV S prime:     11.40 cm/s TAPSE (M-mode): 2.0 cm LEFT ATRIUM             Index        RIGHT ATRIUM           Index LA diam:        3.30 cm 1.96 cm/m   RA Area:     24.80 cm LA Vol (A2C):   62.6 ml 37.12 ml/m  RA Volume:   87.30 ml  51.76 ml/m LA Vol (A4C):   62.1 ml 36.82 ml/m LA Biplane Vol: 67.7 ml 40.14 ml/m  AORTIC VALVE LVOT Vmax:   92.90 cm/s LVOT Vmean:  67.500 cm/s LVOT VTI:    0.217 m  AORTA Ao Root diam: 3.60 cm MITRAL VALVE               TRICUSPID VALVE MV Area (PHT): 3.17 cm    TR Peak grad:   11.7 mmHg MV Decel Time: 239 msec    TR Vmax:        171.00 cm/s MV E velocity: 89.80 cm/s MV A velocity: 36.80 cm/s  SHUNTS MV E/A ratio:  2.44        Systemic VTI:  0.22 m                            Systemic Diam: 2.00 cm Rudean Haskell MD Electronically signed by Rudean Haskell MD Signature Date/Time: 04/12/2021/12:54:21 PM    Final      Microbiology: Results for orders placed or performed during the hospital encounter of 04/11/21  Resp Panel by RT-PCR (Flu A&B, Covid) Nasopharyngeal Swab     Status: None   Collection Time: 04/11/21 11:20 AM   Specimen: Nasopharyngeal Swab; Nasopharyngeal(NP) swabs in vial transport medium  Result Value Ref Range Status   SARS Coronavirus 2 by RT PCR NEGATIVE NEGATIVE Final    Comment: (NOTE) SARS-CoV-2 target nucleic acids are NOT DETECTED.  The SARS-CoV-2 RNA is generally detectable in upper respiratory specimens during the acute phase of infection. The lowest concentration of SARS-CoV-2 viral copies this assay can detect is 138 copies/mL. A negative result does not preclude SARS-Cov-2 infection and should not be used as the sole basis for treatment or other patient management decisions. A negative result may occur with  improper specimen collection/handling, submission of specimen other than nasopharyngeal swab, presence of viral mutation(s) within the areas targeted by this assay, and inadequate number of viral copies(<138 copies/mL). A negative result must be combined with clinical observations, patient history, and epidemiological information. The expected result is Negative.  Fact Sheet for Patients:  EntrepreneurPulse.com.au  Fact Sheet for Healthcare Providers:  IncredibleEmployment.be  This test is no t yet approved or cleared by the [place] FDA and  has been authorized for detection and/or diagnosis of  SARS-CoV-2 by FDA under an Emergency Use Authorization (EUA). This EUA will remain  in effect (meaning this test can be used) for the duration of the COVID-19 declaration under Section 564(b)(1) of the Act, 21 U.S.C.section 360bbb-3(b)(1), unless the authorization is terminated  or revoked sooner.       Influenza A by PCR NEGATIVE NEGATIVE Final   Influenza B by PCR NEGATIVE NEGATIVE Final    Comment: (NOTE) The Xpert  Xpress SARS-CoV-2/FLU/RSV plus assay is intended as an aid in the diagnosis of influenza from Nasopharyngeal swab specimens and should not be used as a sole basis for treatment. Nasal washings and aspirates are unacceptable for Xpert Xpress SARS-CoV-2/FLU/RSV testing.  Fact Sheet for Patients: EntrepreneurPulse.com.au  Fact Sheet for Healthcare Providers: IncredibleEmployment.be  This test is not yet approved or cleared by the [place] FDA and has been authorized for detection and/or diagnosis of SARS-CoV-2 by FDA under an Emergency Use Authorization (EUA). This EUA will remain in effect (meaning this test can be used) for the duration of the COVID-19 declaration under Section 564(b)(1) of the Act, 21 U.S.C. section 360bbb-3(b)(1), unless the authorization is terminated or revoked.  Performed at [facility], [address], [place], [place] [address]   MRSA Next Gen by PCR, Nasal     Status: None   Collection Time: 04/11/21  8:30 PM   Specimen: Nasal Mucosa; Nasal Swab  Result Value Ref Range Status   MRSA by PCR Next Gen NOT DETECTED NOT DETECTED Final    Comment: (NOTE) The GeneXpert MRSA Assay (FDA approved for NASAL specimens only), is one component of a comprehensive MRSA colonization surveillance program. It is not intended to diagnose MRSA infection nor to guide or monitor treatment for MRSA infections. Test performance is not FDA approved in patients less than 80 years old. Performed at [facility], [address], [place], [place] [address]     Labs: CBC: Recent Labs  Lab 04/11/21 1109 04/12/21 0615 04/13/21 0538  WBC 6.5 5.8 5.3  HGB 15.9* 13.2 12.4  HCT 46.6* 39.2 37.6  MCV 97.1 99.2 100.8*  PLT 282 224 431   Basic Metabolic Panel: Recent Labs  Lab 04/07/21 1558 04/11/21 1109 04/11/21 1203 04/12/21 0615 04/13/21 0538  NA  --  131*  --  135 136  K  --  4.0  --  4.0 4.2  CL  --  97*  --  108 106  CO2  --  22   --  22 24  GLUCOSE  --  147*  --  113* 99  BUN  --  15  --  15 13  CREATININE 0.80 0.81  --  0.56 0.64  CALCIUM  --  9.0  --  8.3* 8.8*  MG  --   --  2.3  --  2.2   Liver Function Tests: Recent Labs  Lab 04/11/21 1109 04/12/21 0615 04/13/21 0538  AST 39 24 20  ALT 75* 50* 41  ALKPHOS 86 63 58  BILITOT 0.3 0.3 0.5  PROT 6.9 5.5* 5.4*  ALBUMIN 3.8 3.1* 3.3*   CBG: No results for input(s): GLUCAP in the last 168 hours.  Discharge time spent: greater than 30 minutes.  Signed: Irwin Brakeman, MD [facility] 04/13/2021

## 2021-04-13 NOTE — Progress Notes (Signed)
ANTICOAGULATION CONSULT NOTE - Follow Up Consult  Pharmacy Consult for heparin Indication: atrial fibrillation  Labs: Recent Labs    04/11/21 1109 04/11/21 1331 04/11/21 2313 04/12/21 0615 04/13/21 0538  HGB 15.9*  --   --  13.2 12.4  HCT 46.6*  --   --  39.2 37.6  PLT 282  --   --  224 232  HEPARINUNFRC  --   --  0.23* 0.45 0.65  CREATININE 0.81  --   --  0.56 0.64  TROPONINIHS 4 4  --   --   --     Assessment: 71yo female subtherapeutic on heparin with initial dosing for Afib; no infusion issues or signs of bleeding per RN. HL 0.65 is therapeutic.  Goal of Therapy:  Heparin level 0.3-0.7 units/ml   Plan:  Will continue Heparin at 1050 units/hr  Check anti-Xa level daily  Monitor CBC, platelets F/U plan   Paige Zimmerman, BS Paige Zimmerman, BCPS Clinical Pharmacist Pager [phone] 04/13/2021,7:39 AM

## 2021-04-13 NOTE — Assessment & Plan Note (Signed)
Pt responded well to IV diltiazem and restarted on metoprolol 25 mg BID Pt now back in SR.  Cardiology has seen patient and recommended increasing to 37.5 mg BID, with an order to take an extra 25 mg tab ONLY if needed for palpitations Close outpatient follow up with cardiology has been arranged by them.  Ok to DC home today per Dr Harl Bowie.

## 2021-04-13 NOTE — Plan of Care (Signed)
  Problem: Education: Goal: Knowledge of General Education information will improve Description Including pain rating scale, medication(s)/side effects and non-pharmacologic comfort measures Outcome: Progressing   Problem: Health Behavior/Discharge Planning: Goal: Ability to manage health-related needs will improve Outcome: Progressing   

## 2021-04-13 NOTE — Progress Notes (Signed)
Nutrition Brief Note  Patient identified on the Malnutrition Screening Tool (MST) Report  Wt Readings from Last 15 Encounters:  04/13/21 68.6 kg  04/01/21 65 kg  01/21/20 67.1 kg  07/12/19 67.6 kg  06/15/19 68 kg  06/08/19 68.5 kg  05/22/19 68.5 kg    Paige Zimmerman  is a 71 y.o. female with past medical history relevant for alcohol and tobacco abuse, osteopenia, depression/anxiety, history of  CAD (s/p NST in 05/2019 showing ischemia with cath showing mild nonobstructive CAD), HTN, COPD and paroxysmal atrial flutter/atrial fibrillation who presents to the ED with recurrent episodes of palpitations, dizziness, dyspnea on exertion and occasional chest discomfort at times with activity over the last several days  Pt admitted with a-fib with RVR.   Per MD notes, plan for discharge home today.   Labs reviewed.   Current diet order is Heart Healthy, patient is consuming approximately 75%% of meals at this time. Labs and medications reviewed.   No nutrition interventions warranted at this time. If nutrition issues arise, please consult RD.   Loistine Chance, RD, LDN, [place] Registered Dietitian II Certified Diabetes Care and Education Specialist Please refer to [facility] for RD and/or RD on-call/weekend/after hours pager

## 2021-04-13 NOTE — Consult Note (Addendum)
Cardiology Consultation:   Patient ID: LAKETTA SODERBERG MRN: [id number]; DOB: 1950-08-02  Admit date: 04/11/2021 Date of Consult: 04/13/2021  PCP:  Caryl Bis, [place] Providers Cardiologist:  Carlyle Dolly, MD        Patient Profile:   Paige Zimmerman is a 71 y.o. female with a hx of mild nonobstructive CAD, new Aflutter with RVR 04/01/21 who is being seen 04/13/2021 for the evaluation of Aflutter with RVR at the request of Dr. Wynetta Emery.  History of Present Illness:   Paige Zimmerman with past medical history of CAD (s/p NST in 05/2019 showing ischemia with cath showing mild nonobstructive CAD), HTN, COPD and tobacco use, ETOH. She saw [place] PA-C 04/01/21 and was in [place] with RVR likely for 3-4 weeks. She was started on metoprolol 50 mg bid and Eliquis 5 mg bid. She came back 04/07/21 and converted to NSR but was complaining of chest pain and worsening dyspnea. She was bradycardic and Lopressor reduced to 25 mg bid. CTA no PE, 3 v CAD, aortic atherosclerosis. She thought she was told to stop eliquis so hasn't taken since 04/07/21  Patient called in with elevated HR's and told to go to ED-back in [place] with RVR. No back in NSR. She smoke 1 ppd and drinks 3-8 beers daily. Says she could cut back if she could get her depression under control.upset with daughter who's a drug addict. She says she doesn't like to take meds and plays around with her meds and stopped antidepressants at one point but now back on.   Past Medical History:  Diagnosis Date   ADHD    CAD (coronary artery disease)    a. 05/2019: cath showing minimal nonobstructive CAD with risk factor modification recommended.   COPD (chronic obstructive pulmonary disease) (HCC)    Depression    Hypertension    Osteoporosis    PVD (peripheral vascular disease) ([place])    Tobacco abuse     Past Surgical History:  Procedure Laterality Date   APPENDECTOMY  1975   CHOLECYSTECTOMY  2007   LEFT HEART CATH AND  CORONARY ANGIOGRAPHY N/A 06/15/2019   Procedure: LEFT HEART CATH AND CORONARY ANGIOGRAPHY;  Surgeon: Jettie Booze, MD;  Location: [facility];  Service: Cardiovascular;  Laterality: N/A;   NECK SURGERY  2010   OVARY SURGERY     TOTAL ABDOMINAL HYSTERECTOMY  1975   VENTRAL HERNIA REPAIR  1985   WRIST SURGERY Left 09/19/2018     Home Medications:  Prior to Admission medications   Medication Sig Start Date End Date Taking? Authorizing Provider  albuterol (VENTOLIN HFA) 108 (90 Base) MCG/ACT inhaler Inhale 1-2 puffs into the lungs every 6 (six) hours as needed for wheezing or shortness of breath.   Yes [provider]  buPROPion (WELLBUTRIN SR) 150 MG 12 hr tablet Take 150 mg by mouth 2 (two) times daily.   Yes [provider]  DULoxetine (CYMBALTA) 60 MG capsule Take 60 mg by mouth 2 (two) times daily.   Yes [provider]  gabapentin (NEURONTIN) 300 MG capsule Take 300 mg by mouth 2 (two) times daily.   Yes [provider]  isosorbide mononitrate (IMDUR) 30 MG 24 hr tablet Take 0.5 tablets (15 mg total) by mouth daily. 06/08/19 04/12/21 Yes [place], Alphonse Guild, MD  metoprolol tartrate (LOPRESSOR) 25 MG tablet Take 1 tablet (25 mg total) by mouth 2 (two) times daily. 04/07/21 07/06/21 Yes Strader, Fransisco Hertz, PA-C  nitroGLYCERIN (NITROSTAT) 0.4 MG SL tablet Place 1 tablet (0.4 mg total) under the tongue every 5 (five) minutes as needed for chest pain. 06/08/19 04/12/21 Yes Kittie Krizan, Alphonse Guild, MD  rosuvastatin (CRESTOR) 5 MG tablet Take 5 mg by mouth daily. 03/10/21  Yes [provider]  umeclidinium-vilanterol (ANORO ELLIPTA) 62.5-25 MCG/INH AEPB Inhale 1 puff into the lungs daily.   Yes [provider]  acetaminophen (TYLENOL) 500 MG tablet Take 1,000 mg by mouth every 6 (six) hours as needed (for pain/headaches.). Patient not taking: Reported on 04/01/2021    [provider]  apixaban (ELIQUIS) 5 MG TABS tablet Take 1 tablet (5  mg total) by mouth 2 (two) times daily. Patient not taking: Reported on 04/12/2021 04/01/21   Erma Heritage, PA-C    Inpatient Medications: Scheduled Meds:  apixaban  5 mg Oral BID   buPROPion  150 mg Oral BID   chlordiazePOXIDE  10 mg Oral TID   Chlorhexidine Gluconate Cloth  6 each Topical Daily   DULoxetine  60 mg Oral BID   folic acid  1 mg Oral Daily   gabapentin  300 mg Oral BID   isosorbide mononitrate  15 mg Oral Daily   metoprolol tartrate  25 mg Oral BID   multivitamin with minerals  1 tablet Oral Daily   nicotine  21 mg Transdermal Daily   rosuvastatin  5 mg Oral Daily   sodium chloride flush  3 mL Intravenous Q12H   sodium chloride flush  3 mL Intravenous Q12H   thiamine  100 mg Oral Daily   Or   thiamine  100 mg Intravenous Daily   umeclidinium-vilanterol  1 puff Inhalation Daily   Continuous Infusions:  sodium chloride     PRN Meds: sodium chloride, acetaminophen **OR** acetaminophen, albuterol, bisacodyl, guaiFENesin-dextromethorphan, LORazepam **OR** LORazepam, ondansetron **OR** ondansetron (ZOFRAN) IV, polyethylene glycol, sodium chloride flush, traZODone  Allergies:    Allergies  Allergen Reactions   Ace Inhibitors Cough   Sulfa Antibiotics Hives   Penicillins Rash    Social History:   Social History   Socioeconomic History   Marital status: Married    Spouse name: Not on file   Number of children: Not on file   Years of education: Not on file   Highest education level: Not on file  Occupational History   Not on file  Tobacco Use   Smoking status: Every Day    Packs/day: 1.00    Types: Cigarettes   Smokeless tobacco: Never  Vaping Use   Vaping Use: Never used  Substance and Sexual Activity   Alcohol use: Yes    Comment: Everyday 3-8 cans of beer   Drug use: Not on file   Sexual activity: Not on file  Other Topics Concern   Not on file  Social History Narrative   Not on file   Social Determinants of Health   Financial Resource  Strain: Not on file  Food Insecurity: Not on file  Transportation Needs: Not on file  Physical Activity: Not on file  Stress: Not on file  Social Connections: Not on file  Intimate Partner Violence: Not on file    Family History:     Family History  Problem Relation Age of Onset   Heart attack Father 63   Heart attack Sister 10   Hypertension Mother      ROS:  Please see the history of present illness.  Review of Systems  Constitutional: Negative.  HENT: Negative.  Eyes: Negative.   Cardiovascular:  Positive for dyspnea on exertion and palpitations.  Respiratory:  Positive for cough and wheezing.   Hematologic/Lymphatic: Negative.   Musculoskeletal: Negative.  Negative for joint pain.  Gastrointestinal: Negative.   Genitourinary: Negative.   Neurological: Negative.   Psychiatric/Behavioral:  Positive for depression and substance abuse.    All other ROS reviewed and negative.     Physical Exam/Data:   Vitals:   04/13/21 0230 04/13/21 0552 04/13/21 0617 04/13/21 0808  BP: 138/88 136/85    Pulse: 71 61    Resp:  20    Temp:  (!) 97.5 F (36.4 C)    TempSrc:  Oral    SpO2:  97%  94%  Weight:   68.6 kg   Height:        Intake/Output Summary (Last 24 hours) at 04/13/2021 0911 Last data filed at 04/12/2021 1900 Gross per 24 hour  Intake 282.23 ml  Output --  Net 282.23 ml   Last 3 Weights 04/13/2021 04/11/2021 04/01/2021  Weight (lbs) 151 lb 3.8 oz 145 lb 143 lb 3.2 oz  Weight (kg) 68.6 kg 65.772 kg 64.955 kg     Body mass index is 26.79 kg/m.  General:  Well nourished, well developed, in no acute distress  HEENT: normal Neck: no JVD Vascular: No carotid bruits; Distal pulses 2+  bilaterally Cardiac:  normal S1, S2; RRR; no murmur  Lungs:  decreased breath sounds with scattered wheezing, rhonchi Abd: soft, nontender, no hepatomegaly  Ext: no edema Musculoskeletal:  No deformities, BUE and BLE strength normal and equal Skin: warm and dry  Neuro:  CNs 2-12  intact, no focal abnormalities noted Psych:  Normal affect   EKG:  The EKG was personally reviewed and demonstrates:  Aflutter with RVR  Telemetry:  Telemetry was personally reviewed and demonstrates:  NSR  Relevant CV Studies:  CTA 04/07/21 IMPRESSION: 1. No evidence of pulmonary embolism. 2. Signs of three-vessel coronary artery disease. 3. Aortic atherosclerosis   Aortic Atherosclerosis (ICD10-I70.0).     Electronically Signed   By: Zetta Bills M.D.   On: 04/07/2021 16:21    ABI's: 01/2020 FINDINGS:  Right ABI:  1.16   Left ABI:  1.13   Right Lower Extremity:  Normal arterial waveforms at the ankle.   Left Lower Extremity:  Normal arterial waveforms at the ankle.   1.0-1.4 Normal   IMPRESSION:  Normal resting ankle-brachial indices bilaterally.    Cardiac Catheterization: 05/2019 Prox RCA lesion is 25% stenosed. Prox Cx to Mid Cx lesion is 25% stenosed. Mid LAD lesion is 10% stenosed. The left ventricular systolic function is normal. LV end diastolic pressure is normal. The left ventricular ejection fraction is 55-65% by visual estimate. There is no aortic valve stenosis.   Minimal, nonobstructive CAD.  Continue preventive therapy.  I recommended that she stop smoking.  Laboratory Data:  High Sensitivity Troponin:   Recent Labs  Lab 04/11/21 1109 04/11/21 1331  TROPONINIHS 4 4     Chemistry Recent Labs  Lab 04/11/21 1109 04/11/21 1203 04/12/21 0615 04/13/21 0538  NA 131*  --  135 136  K 4.0  --  4.0 4.2  CL 97*  --  108 106  CO2 22  --  22 24  GLUCOSE 147*  --  113* 99  BUN 15  --  15 13  CREATININE 0.81  --  0.56 0.64  CALCIUM 9.0  --  8.3* 8.8*  MG  --  2.3  --  2.2  GFRNONAA >60  --  >60 >60  ANIONGAP 12  --  5 6    Recent Labs  Lab 04/11/21 1109 04/12/21 0615 04/13/21 0538  PROT 6.9 5.5* 5.4*  ALBUMIN 3.8 3.1* 3.3*  AST 39 24 20  ALT 75* 50* 41  ALKPHOS 86 63 58  BILITOT 0.3 0.3 0.5   Lipids No results for input(s): CHOL,  TRIG, HDL, LABVLDL, LDLCALC, CHOLHDL in the last 168 hours.  Hematology Recent Labs  Lab 04/11/21 1109 04/12/21 0615 04/13/21 0538  WBC 6.5 5.8 5.3  RBC 4.80 3.95 3.73*  HGB 15.9* 13.2 12.4  HCT 46.6* 39.2 37.6  MCV 97.1 99.2 100.8*  MCH 33.1 33.4 33.2  MCHC 34.1 33.7 33.0  RDW 13.3 13.7 13.9  PLT 282 224 232   Thyroid  Recent Labs  Lab 04/11/21 1602  TSH 5.001*    BNP Recent Labs  Lab 04/11/21 1109  BNP 378.0*    DDimer  Recent Labs  Lab 04/11/21 1109  DDIMER 0.43     Radiology/Studies:  DG Chest Port 1 View  Result Date: 04/11/2021 CLINICAL DATA:  Chest pain EXAM: PORTABLE CHEST 1 VIEW COMPARISON:  Radiograph 02/09/2021 FINDINGS: Unchanged cardiomediastinal silhouette. No focal airspace consolidation. No large pleural effusion. No visible pneumothorax. Cervical spine fusion hardware noted. Bilateral shoulder osteoarthritis. Thoracic spondylosis. Fat containing left medial basilar hernia as seen on recent CT. IMPRESSION: No evidence of acute cardiopulmonary disease. Electronically Signed   By: Maurine Simmering M.D.   On: 04/11/2021 11:45   ECHOCARDIOGRAM COMPLETE  Result Date: 04/12/2021    ECHOCARDIOGRAM REPORT   Patient Name:   HADDIE BRUHL [facility] Date of Exam: 04/12/2021 Medical Rec #:  [id number]      Height:       63.0 in Accession #:    [id number]     Weight:       145.0 lb Date of Birth:  October 15, 1950      BSA:          1.687 m Patient Age:    41 years       BP:           121/70 mmHg Patient Gender: F              HR:           63 bpm. Exam Location:  [facility] Procedure: 2D Echo, Cardiac Doppler and Color Doppler Indications:    Abnormal ECG R94.31  History:        Patient has no prior history of Echocardiogram examinations.                 CAD, COPD; Risk Factors:Hypertension and Current Smoker.                 Paroxysmal Afib/Atrial flutter with RVR, Alcohol abuse.  Sonographer:    Alvino Chapel RCS Referring Phys: [phone] COURAGE EMOKPAE IMPRESSIONS  1. Left ventricular  ejection fraction, by estimation, is 55 to 60%. The left ventricle has normal function. The left ventricle has no regional wall motion abnormalities. Left ventricular diastolic parameters are consistent with Grade III diastolic dysfunction (restrictive).  2. Right ventricular systolic function is normal. The right ventricular size is normal. There is normal pulmonary artery systolic pressure.  3. Left atrial size was moderately dilated.  4. Right atrial size was severely dilated.  5. The mitral valve is normal in structure. Mild mitral valve regurgitation. No evidence of mitral stenosis.  6. The aortic valve  is tricuspid. Aortic valve regurgitation is not visualized. No aortic stenosis is present.  7. The inferior vena cava is normal in size with greater than 50% respiratory variability, suggesting right atrial pressure of 3 mmHg. Comparison(s): No prior Echocardiogram. FINDINGS  Left Ventricle: Left ventricular ejection fraction, by estimation, is 55 to 60%. The left ventricle has normal function. The left ventricle has no regional wall motion abnormalities. The left ventricular internal cavity size was normal in size. There is  no left ventricular hypertrophy. Left ventricular diastolic parameters are consistent with Grade III diastolic dysfunction (restrictive). Right Ventricle: The right ventricular size is normal. No increase in right ventricular wall thickness. Right ventricular systolic function is normal. There is normal pulmonary artery systolic pressure. The tricuspid regurgitant velocity is 1.71 m/s, and  with an assumed right atrial pressure of 3 mmHg, the estimated right ventricular systolic pressure is 42.7 mmHg. Left Atrium: Left atrial size was moderately dilated. Right Atrium: Right atrial size was severely dilated. Pericardium: There is no evidence of pericardial effusion. Mitral Valve: The mitral valve is normal in structure. Mild mitral valve regurgitation. No evidence of mitral valve stenosis.  Tricuspid Valve: The tricuspid valve is normal in structure. Tricuspid valve regurgitation is mild . No evidence of tricuspid stenosis. Aortic Valve: The aortic valve is tricuspid. Aortic valve regurgitation is not visualized. No aortic stenosis is present. Pulmonic Valve: The pulmonic valve was grossly normal. Pulmonic valve regurgitation is not visualized. No evidence of pulmonic stenosis. Aorta: The aortic root is normal in size and structure. Venous: The inferior vena cava is normal in size with greater than 50% respiratory variability, suggesting right atrial pressure of 3 mmHg. IAS/Shunts: No atrial level shunt detected by color flow Doppler.  LEFT VENTRICLE PLAX 2D LVIDd:         5.10 cm   Diastology LVIDs:         3.00 cm   LV e' medial:    6.64 cm/s LV PW:         1.10 cm   LV E/e' medial:  13.5 LV IVS:        0.90 cm   LV e' lateral:   9.03 cm/s LVOT diam:     2.00 cm   LV E/e' lateral: 9.9 LV SV:         68 LV SV Index:   40 LVOT Area:     3.14 cm  RIGHT VENTRICLE RV S prime:     11.40 cm/s TAPSE (M-mode): 2.0 cm LEFT ATRIUM             Index        RIGHT ATRIUM           Index LA diam:        3.30 cm 1.96 cm/m   RA Area:     24.80 cm LA Vol (A2C):   62.6 ml 37.12 ml/m  RA Volume:   87.30 ml  51.76 ml/m LA Vol (A4C):   62.1 ml 36.82 ml/m LA Biplane Vol: 67.7 ml 40.14 ml/m  AORTIC VALVE LVOT Vmax:   92.90 cm/s LVOT Vmean:  67.500 cm/s LVOT VTI:    0.217 m  AORTA Ao Root diam: 3.60 cm MITRAL VALVE               TRICUSPID VALVE MV Area (PHT): 3.17 cm    TR Peak grad:   11.7 mmHg MV Decel Time: 239 msec    TR Vmax:  171.00 cm/s MV E velocity: 89.80 cm/s MV A velocity: 36.80 cm/s  SHUNTS MV E/A ratio:  2.44        Systemic VTI:  0.22 m                            Systemic Diam: 2.00 cm Rudean Haskell MD Electronically signed by Rudean Haskell MD Signature Date/Time: 04/12/2021/12:54:21 PM    Final      Assessment and Plan:   Afib/flutter with RVR metoprolol decreased from 50 mg bid  to 25 mg bid. Patient was on eliquis but she stopped 04/07/21. Echo 04/12/21 normal LVEF, grade 3 DD(restrictive), LA mod dilated RA severely dilated. Now back on eliquis & metoprolol 25 mg bid.   CAD abn NST 05/2019, cath mild nonobstructive disease,  CTA 04/07/21 aortic atherosclerosis 3 v CAD, no PE. On Imdur  HTN controlled  HFpEF compensated(normal LVEF grade 3 DD  ETOH drinks 3-8 beers daily. Reduction/cessation discussed  COPD  Tobacco abuse-smokes 1 ppd and unable to quit.    Risk Assessment/Risk Scores:          CHA2DS2-VASc Score = 5   This indicates a 7.2% annual risk of stroke. The patient's score is based upon: CHF History: 1 HTN History: 1 Diabetes History: 0 Stroke History: 0 Vascular Disease History: 1 Age Score: 1 Gender Score: 1         For questions or updates, please contact [place] Please consult [email or web address] for contact info under    Signed, Ermalinda Barrios, PA-C  04/13/2021 9:11 AM   Attending note Patient seen and discussed with PA Bonnell Public, I agree with her documentation. 71 yo female mild CAD by cath 05/2019, EtOH abuse, HTN and new diagnosis of aflutter in clinic 04/01/21. At that time started on lopressor 50mg  bid and eliquis 5mg  bid. F/u EKG 04/07/21 showed she was back in sinus brady at 56, lopressor lowered to 25mg  bid. Recurrent symptoms and elevated HRs at home, presented to ER 04/11/21, found to be in aflutter with RVR   Admit labs WBC 6.5 Hgb 15.9 Plt 282 Ddimer 0.43 K 4 Cr 0.81 BNP 378 Mg 2.3 TSH 5 Trop 4-->4 COVID neg flu neg CXR no acute process Echo: LVE 55-60%, no WMAs, grade III dd, normal  RV, BAE LAVI 40 EKG aflutter RVR  04/07/21 CT PE: no PE   Patient admitted with aflutter with RVR, has been paroxysmal over the last several weeks. Started on IV dilt gtt and hep gtt initially. Was able to transition off dilt gtt and back on lopressor 25mg  bid. BP's tolerating lopressor, she is back in SR today.  She is on eliquis for  stroke prevention EtOH abuse is going to may arrhythmia control more difficult, disussed with patient. Mixed compliance with anticoag thus far though appears to have misunderstood directions. EtoH use would affect considerations for antiarrhythmics.  Would focus on rate control for now and plan for outpatient follow up. Increase lopressor to 37.5mg  bid, may take additional 25mg  prn palpitations.   [place] for discharge. Will arrange outpatient EP follow up.  Carlyle Dolly MD

## 2021-04-13 NOTE — TOC Progression Note (Signed)
Transition of Care [facility]) - Progression Note    Patient Details  Name: Paige Zimmerman MRN: [id number] Date of Birth: [birth year]  Transition of Care [facility]) CM/SW Contact  Salome Arnt, [place] Phone Number: 04/13/2021, 8:36 AM  Clinical Narrative:  TOC received consult for substance abuse counseling. Discussed with pt who reports she drinks up to 10 beers daily. Pt indicates she does not feel this would be a problem for her if she could get her depression under control. Pt states her PCP is working on setting up outpatient counseling for her now and does not need any other resources at this time.           Expected Discharge Plan and Services                                                 Social Determinants of Health (SDOH) Interventions    Readmission Risk Interventions No flowsheet data found.

## 2021-04-13 NOTE — Discharge Instructions (Addendum)
Increase lopressor to 37.5mg  twice daily, may take additional 25 mg as needed for palpitations.    IMPORTANT INFORMATION: PAY CLOSE ATTENTION   PHYSICIAN DISCHARGE INSTRUCTIONS  Follow with Primary care provider  Caryl Bis, MD  and other consultants as instructed by your Hospitalist Physician  [place] IF SYMPTOMS COME BACK, WORSEN OR NEW PROBLEM DEVELOPS   Please note: You were cared for by a hospitalist during your hospital stay. Every effort will be made to forward records to your primary care provider.  You can request that your primary care provider send for your hospital records if they have not received them.  Once you are discharged, your primary care physician will handle any further medical issues. Please note that NO REFILLS for any discharge medications will be authorized once you are discharged, as it is imperative that you return to your primary care physician (or establish a relationship with a primary care physician if you do not have one) for your post hospital discharge needs so that they can reassess your need for medications and monitor your lab values.  Please get a complete blood count and chemistry panel checked by your Primary MD at your next visit, and again as instructed by your Primary MD.  Get Medicines reviewed and adjusted: Please take all your medications with you for your next visit with your Primary MD  Laboratory/radiological data: Please request your Primary MD to go over all hospital tests and procedure/radiological results at the follow up, please ask your primary care provider to get all Hospital records sent to his/her office.  In some cases, they will be blood work, cultures and biopsy results pending at the time of your discharge. Please request that your primary care provider follow up on these results.  If you are diabetic, please bring your blood sugar readings with you to your follow up appointment with  primary care.    Please call and make your follow up appointments as soon as possible.    Also Note the following: If you experience worsening of your admission symptoms, develop shortness of breath, life threatening emergency, suicidal or homicidal thoughts you must seek medical attention immediately by calling 911 or calling your MD immediately  if symptoms less severe.  You must read complete instructions/literature along with all the possible adverse reactions/side effects for all the Medicines you take and that have been prescribed to you. Take any new Medicines after you have completely understood and accpet all the possible adverse reactions/side effects.   Do not drive when taking Pain medications or sleeping medications (Benzodiazepines)  Do not take more than prescribed Pain, Sleep and Anxiety Medications. It is not advisable to combine anxiety,sleep and pain medications without talking with your primary care practitioner  Special Instructions: If you have smoked or chewed Tobacco  in the last 2 yrs please stop smoking, stop any regular Alcohol  and or any Recreational drug use.  Wear Seat belts while driving.  Do not drive if taking any narcotic, mind altering or controlled substances or recreational drugs or alcohol.      '

## 2021-04-13 NOTE — Progress Notes (Signed)
ANTICOAGULATION CONSULT NOTE - Follow Up Consult  Pharmacy Consult for heparin --> Apixaban Indication: Hx atrial fibrillation  Labs: Recent Labs    04/11/21 1109 04/11/21 1331 04/11/21 2313 04/12/21 0615 04/13/21 0538  HGB 15.9*  --   --  13.2 12.4  HCT 46.6*  --   --  39.2 37.6  PLT 282  --   --  224 232  HEPARINUNFRC  --   --  0.23* 0.45 0.65  CREATININE 0.81  --   --  0.56 0.64  TROPONINIHS 4 4  --   --   --     Assessment: 71 yo female hx Afib transitioning from heparin back to Apixaban. PTA home regimen 5mg  BID. No s/sx bleeding     Plan:  DC Heparin drip DC Heparin levels Resume Apixaban 5mg  po BID

## 2021-04-21 DIAGNOSIS — E782 Mixed hyperlipidemia: Secondary | ICD-10-CM | POA: Diagnosis not present

## 2021-04-21 DIAGNOSIS — I10 Essential (primary) hypertension: Secondary | ICD-10-CM | POA: Diagnosis not present

## 2021-04-24 DIAGNOSIS — Z720 Tobacco use: Secondary | ICD-10-CM | POA: Diagnosis not present

## 2021-05-07 ENCOUNTER — Ambulatory Visit: Payer: Medicare Other | Admitting: Student

## 2021-05-11 ENCOUNTER — Other Ambulatory Visit: Payer: Self-pay | Admitting: Cardiology

## 2021-05-12 ENCOUNTER — Institutional Professional Consult (permissible substitution): Payer: Medicare Other | Admitting: Internal Medicine

## 2021-05-13 DIAGNOSIS — D6869 Other thrombophilia: Secondary | ICD-10-CM | POA: Diagnosis not present

## 2021-05-13 DIAGNOSIS — I700 Atherosclerosis of aorta: Secondary | ICD-10-CM | POA: Diagnosis not present

## 2021-05-13 DIAGNOSIS — I480 Paroxysmal atrial fibrillation: Secondary | ICD-10-CM | POA: Diagnosis not present

## 2021-05-13 DIAGNOSIS — I10 Essential (primary) hypertension: Secondary | ICD-10-CM | POA: Diagnosis not present

## 2021-05-13 DIAGNOSIS — I25119 Atherosclerotic heart disease of native coronary artery with unspecified angina pectoris: Secondary | ICD-10-CM | POA: Diagnosis not present

## 2021-05-13 DIAGNOSIS — J440 Chronic obstructive pulmonary disease with acute lower respiratory infection: Secondary | ICD-10-CM | POA: Diagnosis not present

## 2021-05-15 ENCOUNTER — Ambulatory Visit: Payer: Medicare Other | Admitting: Student

## 2021-06-03 DIAGNOSIS — J440 Chronic obstructive pulmonary disease with acute lower respiratory infection: Secondary | ICD-10-CM | POA: Diagnosis not present

## 2021-06-03 DIAGNOSIS — I10 Essential (primary) hypertension: Secondary | ICD-10-CM | POA: Diagnosis not present

## 2021-06-03 DIAGNOSIS — I25119 Atherosclerotic heart disease of native coronary artery with unspecified angina pectoris: Secondary | ICD-10-CM | POA: Diagnosis not present

## 2021-06-03 DIAGNOSIS — I700 Atherosclerosis of aorta: Secondary | ICD-10-CM | POA: Diagnosis not present

## 2021-06-03 DIAGNOSIS — J449 Chronic obstructive pulmonary disease, unspecified: Secondary | ICD-10-CM | POA: Diagnosis not present

## 2021-06-03 DIAGNOSIS — D6869 Other thrombophilia: Secondary | ICD-10-CM | POA: Diagnosis not present

## 2021-06-03 DIAGNOSIS — I482 Chronic atrial fibrillation, unspecified: Secondary | ICD-10-CM | POA: Diagnosis not present

## 2021-06-04 DIAGNOSIS — M810 Age-related osteoporosis without current pathological fracture: Secondary | ICD-10-CM | POA: Diagnosis not present

## 2021-06-04 DIAGNOSIS — M8589 Other specified disorders of bone density and structure, multiple sites: Secondary | ICD-10-CM | POA: Diagnosis not present

## 2021-06-05 DIAGNOSIS — E7849 Other hyperlipidemia: Secondary | ICD-10-CM | POA: Diagnosis not present

## 2021-06-05 DIAGNOSIS — I10 Essential (primary) hypertension: Secondary | ICD-10-CM | POA: Diagnosis not present

## 2021-06-05 DIAGNOSIS — E782 Mixed hyperlipidemia: Secondary | ICD-10-CM | POA: Diagnosis not present

## 2021-06-10 ENCOUNTER — Ambulatory Visit: Payer: Medicare Other | Admitting: Student

## 2021-06-10 DIAGNOSIS — Z23 Encounter for immunization: Secondary | ICD-10-CM | POA: Diagnosis not present

## 2021-06-10 DIAGNOSIS — R4582 Worries: Secondary | ICD-10-CM | POA: Diagnosis not present

## 2021-06-10 DIAGNOSIS — I10 Essential (primary) hypertension: Secondary | ICD-10-CM | POA: Diagnosis not present

## 2021-06-10 DIAGNOSIS — I25119 Atherosclerotic heart disease of native coronary artery with unspecified angina pectoris: Secondary | ICD-10-CM | POA: Diagnosis not present

## 2021-06-10 DIAGNOSIS — I482 Chronic atrial fibrillation, unspecified: Secondary | ICD-10-CM | POA: Diagnosis not present

## 2021-06-10 DIAGNOSIS — D6869 Other thrombophilia: Secondary | ICD-10-CM | POA: Diagnosis not present

## 2021-06-10 DIAGNOSIS — J449 Chronic obstructive pulmonary disease, unspecified: Secondary | ICD-10-CM | POA: Diagnosis not present

## 2021-06-21 DIAGNOSIS — I25119 Atherosclerotic heart disease of native coronary artery with unspecified angina pectoris: Secondary | ICD-10-CM | POA: Diagnosis not present

## 2021-06-21 DIAGNOSIS — J440 Chronic obstructive pulmonary disease with acute lower respiratory infection: Secondary | ICD-10-CM | POA: Diagnosis not present

## 2021-06-21 DIAGNOSIS — E782 Mixed hyperlipidemia: Secondary | ICD-10-CM | POA: Diagnosis not present

## 2021-06-21 DIAGNOSIS — I10 Essential (primary) hypertension: Secondary | ICD-10-CM | POA: Diagnosis not present

## 2021-08-19 DIAGNOSIS — I10 Essential (primary) hypertension: Secondary | ICD-10-CM | POA: Diagnosis not present

## 2021-08-19 DIAGNOSIS — J440 Chronic obstructive pulmonary disease with acute lower respiratory infection: Secondary | ICD-10-CM | POA: Diagnosis not present

## 2021-08-19 DIAGNOSIS — J449 Chronic obstructive pulmonary disease, unspecified: Secondary | ICD-10-CM | POA: Diagnosis not present

## 2021-08-19 DIAGNOSIS — I482 Chronic atrial fibrillation, unspecified: Secondary | ICD-10-CM | POA: Diagnosis not present

## 2021-08-19 DIAGNOSIS — I700 Atherosclerosis of aorta: Secondary | ICD-10-CM | POA: Diagnosis not present

## 2021-08-19 DIAGNOSIS — M8580 Other specified disorders of bone density and structure, unspecified site: Secondary | ICD-10-CM | POA: Diagnosis not present

## 2021-08-19 DIAGNOSIS — I25119 Atherosclerotic heart disease of native coronary artery with unspecified angina pectoris: Secondary | ICD-10-CM | POA: Diagnosis not present

## 2021-08-19 DIAGNOSIS — I739 Peripheral vascular disease, unspecified: Secondary | ICD-10-CM | POA: Diagnosis not present

## 2021-08-19 DIAGNOSIS — D6869 Other thrombophilia: Secondary | ICD-10-CM | POA: Diagnosis not present

## 2021-08-19 DIAGNOSIS — F17210 Nicotine dependence, cigarettes, uncomplicated: Secondary | ICD-10-CM | POA: Diagnosis not present

## 2021-08-26 DIAGNOSIS — D1721 Benign lipomatous neoplasm of skin and subcutaneous tissue of right arm: Secondary | ICD-10-CM | POA: Diagnosis not present

## 2021-10-30 ENCOUNTER — Telehealth: Payer: Self-pay | Admitting: Student

## 2021-10-30 MED ORDER — APIXABAN 5 MG PO TABS: 5.0000 mg | ORAL_TABLET | Freq: Two times a day (BID) | ORAL | 0 refills | Status: AC

## 2021-10-30 NOTE — Telephone Encounter (Signed)
Sample request for Eliquis received. Indication:Aflutter Last office visit:04/01/21 ([place])  Scr: 0.64 (04/13/21)  Age: 71 Weight: 68.6kg  Eliquis '5mg'$  BID is appropriate dose.   Will route back to Christella Scheuermann, [place] for samples.

## 2021-10-30 NOTE — Telephone Encounter (Signed)
Spoke to pt who stated that she was in the donut hole for the time being. Pt stated that she had contacted [place] to inquire about PAF, but was told her husband makes too much money to qualify. Pt will come to [place] office to pick up samples.    Eliquis #14 Lot #: [id number] Exp: 05/2023

## 2021-10-30 NOTE — Telephone Encounter (Signed)
Patient calling the office for samples of medication: ° ° °1.  What medication and dosage are you requesting samples for? ° °apixaban (ELIQUIS) 5 MG TABS tablet ° °2.  Are you currently out of this medication?  yes ° ° °

## 2022-10-29 IMAGING — CT CT ANGIO CHEST
2 of 7 series · 18 of 46 positions shown · IV contrast (Omnipaque or Isovue)
Comparison: March 19, 2021.

CLINICAL DATA: Dyspnea on exertion in a 70-year-old female.

EXAM:
CT ANGIOGRAPHY CHEST WITH CONTRAST
TECHNIQUE: Multidetector CT imaging of the chest was performed using the
standard protocol during bolus administration of intravenous
contrast. Multiplanar CT image reconstructions and MIPs were
obtained to evaluate the vascular anatomy.

[Series 5: pe axial thins · axial · 0.73mm/px · z∈[+1185,+1448]mm · 15 of 371 slices shown]
[im 21/371  lung]
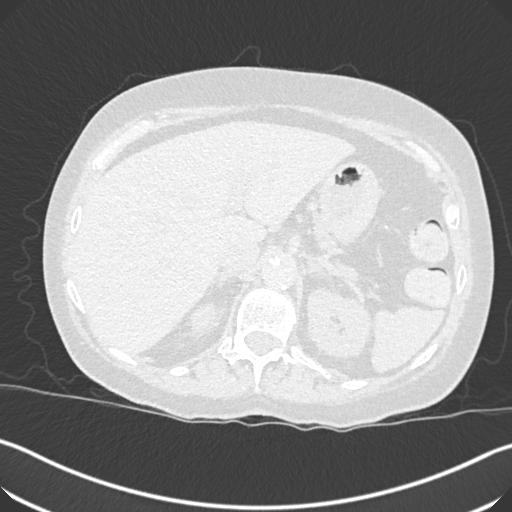
[im 42/371  soft-tissue]
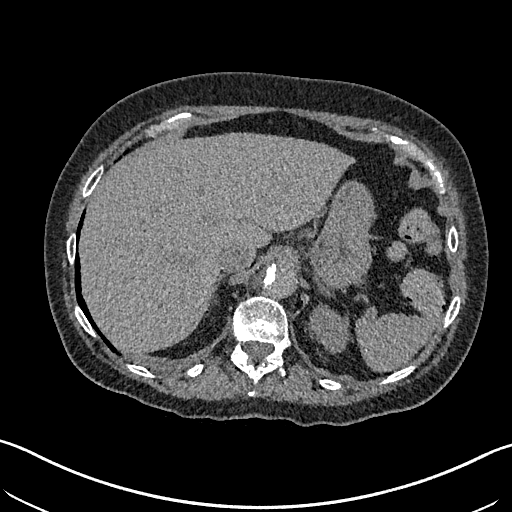
[im 62/371  lung]
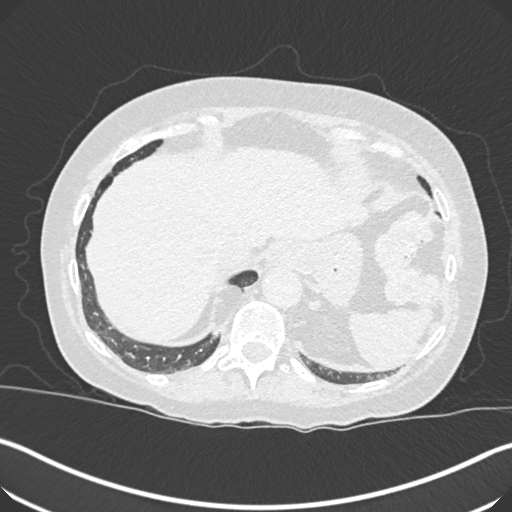
[im 83/371  soft-tissue]
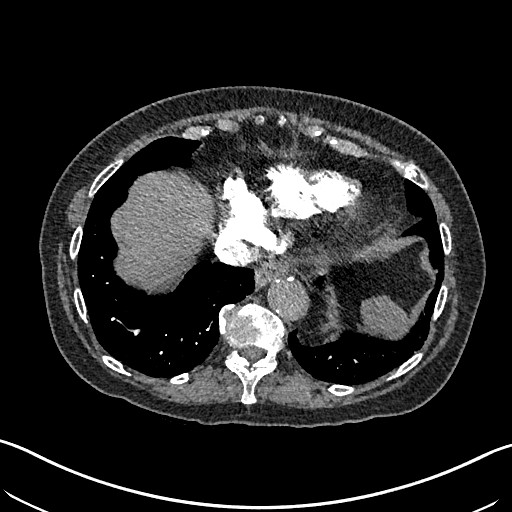
[im 124/371  lung]
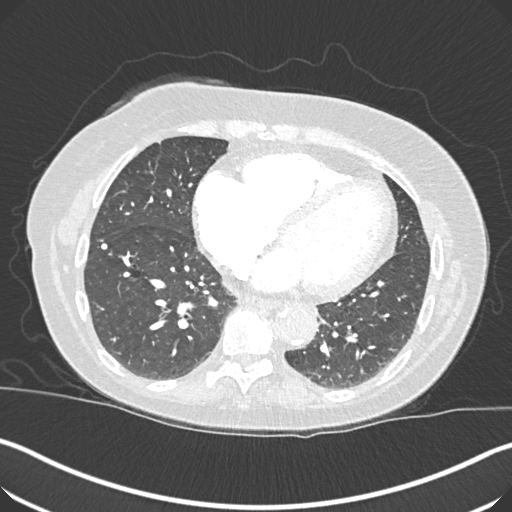
[im 144/371  soft-tissue]
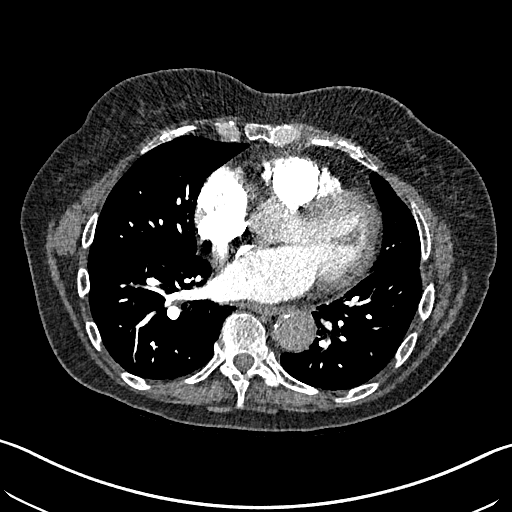
[im 165/371  lung]
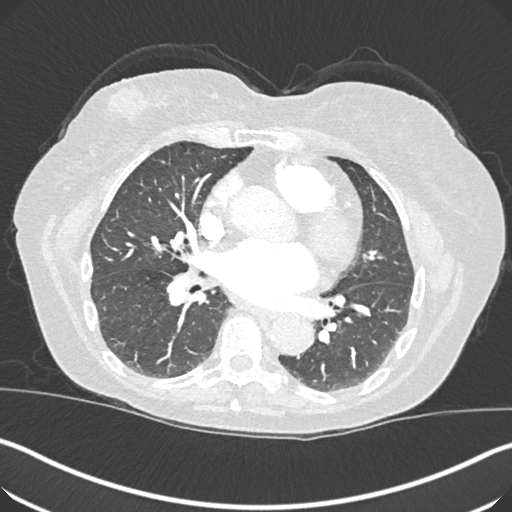
[im 186/371  soft-tissue]
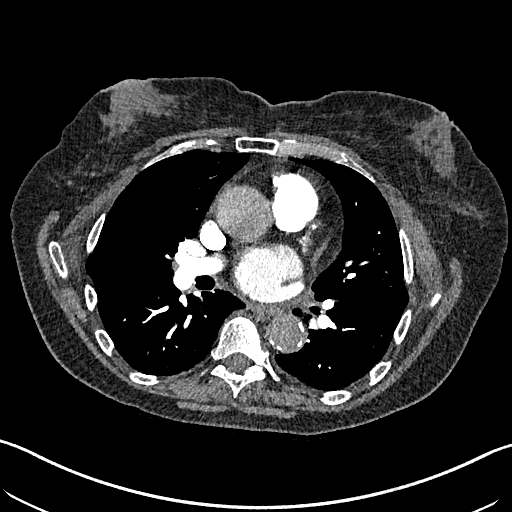
[im 206/371  lung]
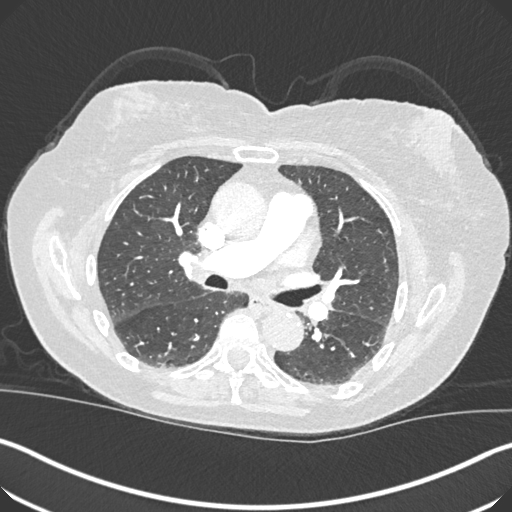
[im 227/371  soft-tissue]
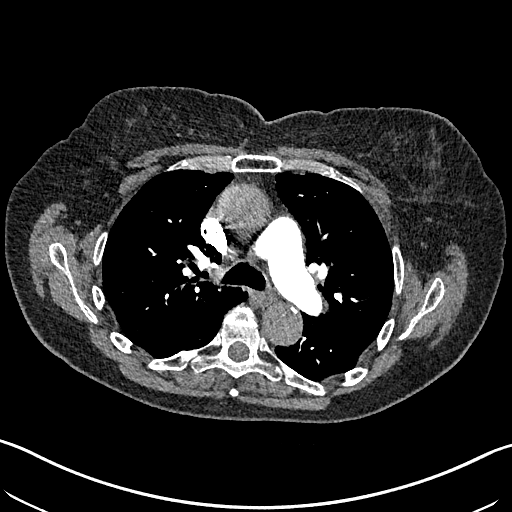
[im 247/371  lung]
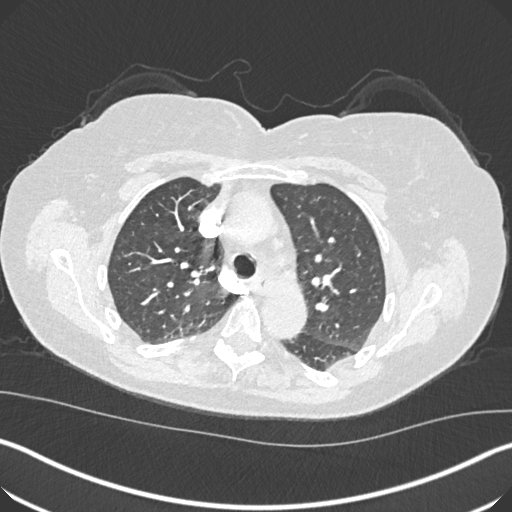
[im 288/371  soft-tissue]
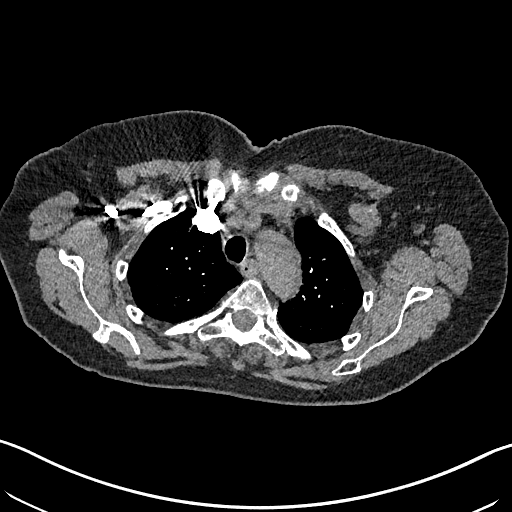
[im 309/371  lung]
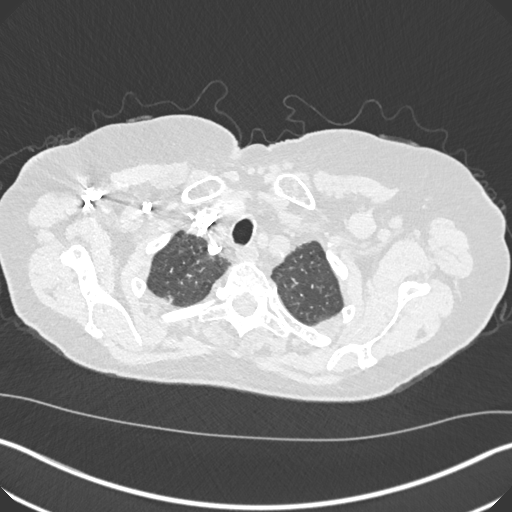
[im 329/371  soft-tissue]
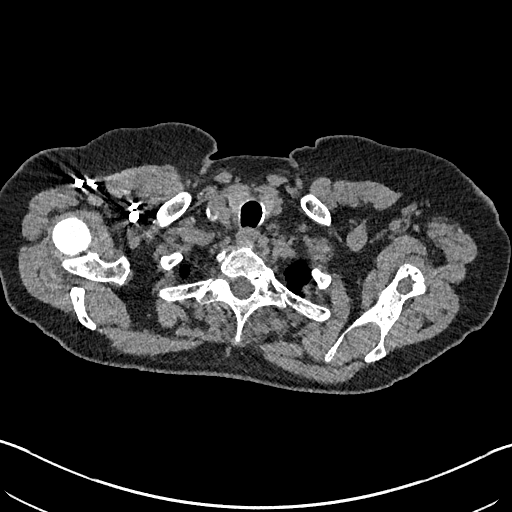
[im 350/371  lung]
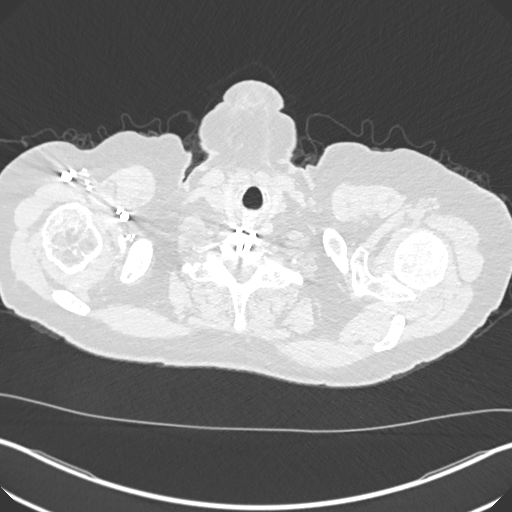

[Series 7: cor soft · coronal · 0.62mm/px · 3 of 146 slices shown]
[im 37/146  soft-tissue]
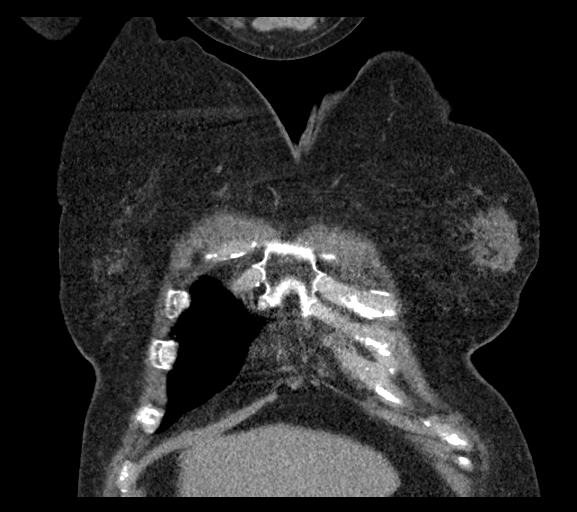
[im 73/146  soft-tissue]
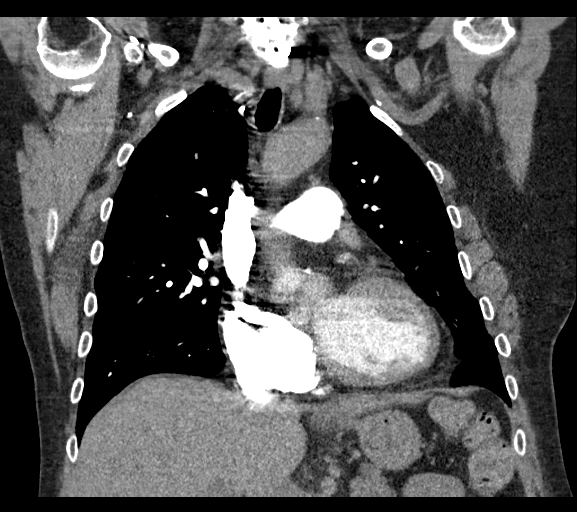
[im 109/146  soft-tissue]
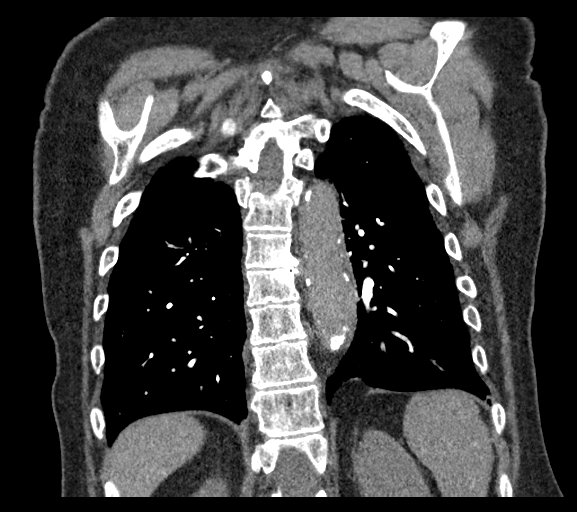

[18 of 46 positions shown; findings below may reference images not displayed]

RADIATION DOSE REDUCTION: This exam was performed according to the
departmental dose-optimization program which includes automated
exposure control, adjustment of the mA and/or kV according to
patient size and/or use of iterative reconstruction technique.

CONTRAST:  75mL OMNIPAQUE IOHEXOL 350 MG/ML SOLN
FINDINGS: Cardiovascular: Calcified atheromatous plaque in the thoracic aorta,
no aneurysmal dilation. Smooth contour of the thoracic aorta without
adjacent stranding. Aorta is not well evaluated. Heart size is
normal. Signs of three-vessel coronary artery disease. No
pericardial effusion. The

Central pulmonary vasculature opacified greater than 600 Hounsfield
units. No evidence of pulmonary embolism with evaluation to the
subsegmental level based on exam quality.

Mediastinum/Nodes: No thoracic inlet, axillary, mediastinal or hilar
adenopathy. Esophagus grossly normal.

Lungs/Pleura: Granulomatous changes in the RIGHT lower and RIGHT
upper lobe with signs of calcified granulomata. No effusion. No
consolidative changes.

Upper Abdomen: No acute upper abdominal process to the extent
evaluated.

Musculoskeletal: No acute bone finding. No destructive bone process.
Spinal degenerative changes. Signs of cervical spinal fusion which
are incompletely evaluated.

Review of the MIP images confirms the above findings.
IMPRESSION: 1. No evidence of pulmonary embolism.
2. Signs of three-vessel coronary artery disease.
3. Aortic atherosclerosis

Aortic Atherosclerosis ([id number].J).

## 2022-11-02 IMAGING — DX DG CHEST 1V PORT
1 series · 1 of 1 positions shown · non-contrast
Comparison: Radiograph 02/09/2021

CLINICAL DATA: Chest pain

EXAM:
PORTABLE CHEST 1 VIEW

[chest ap]
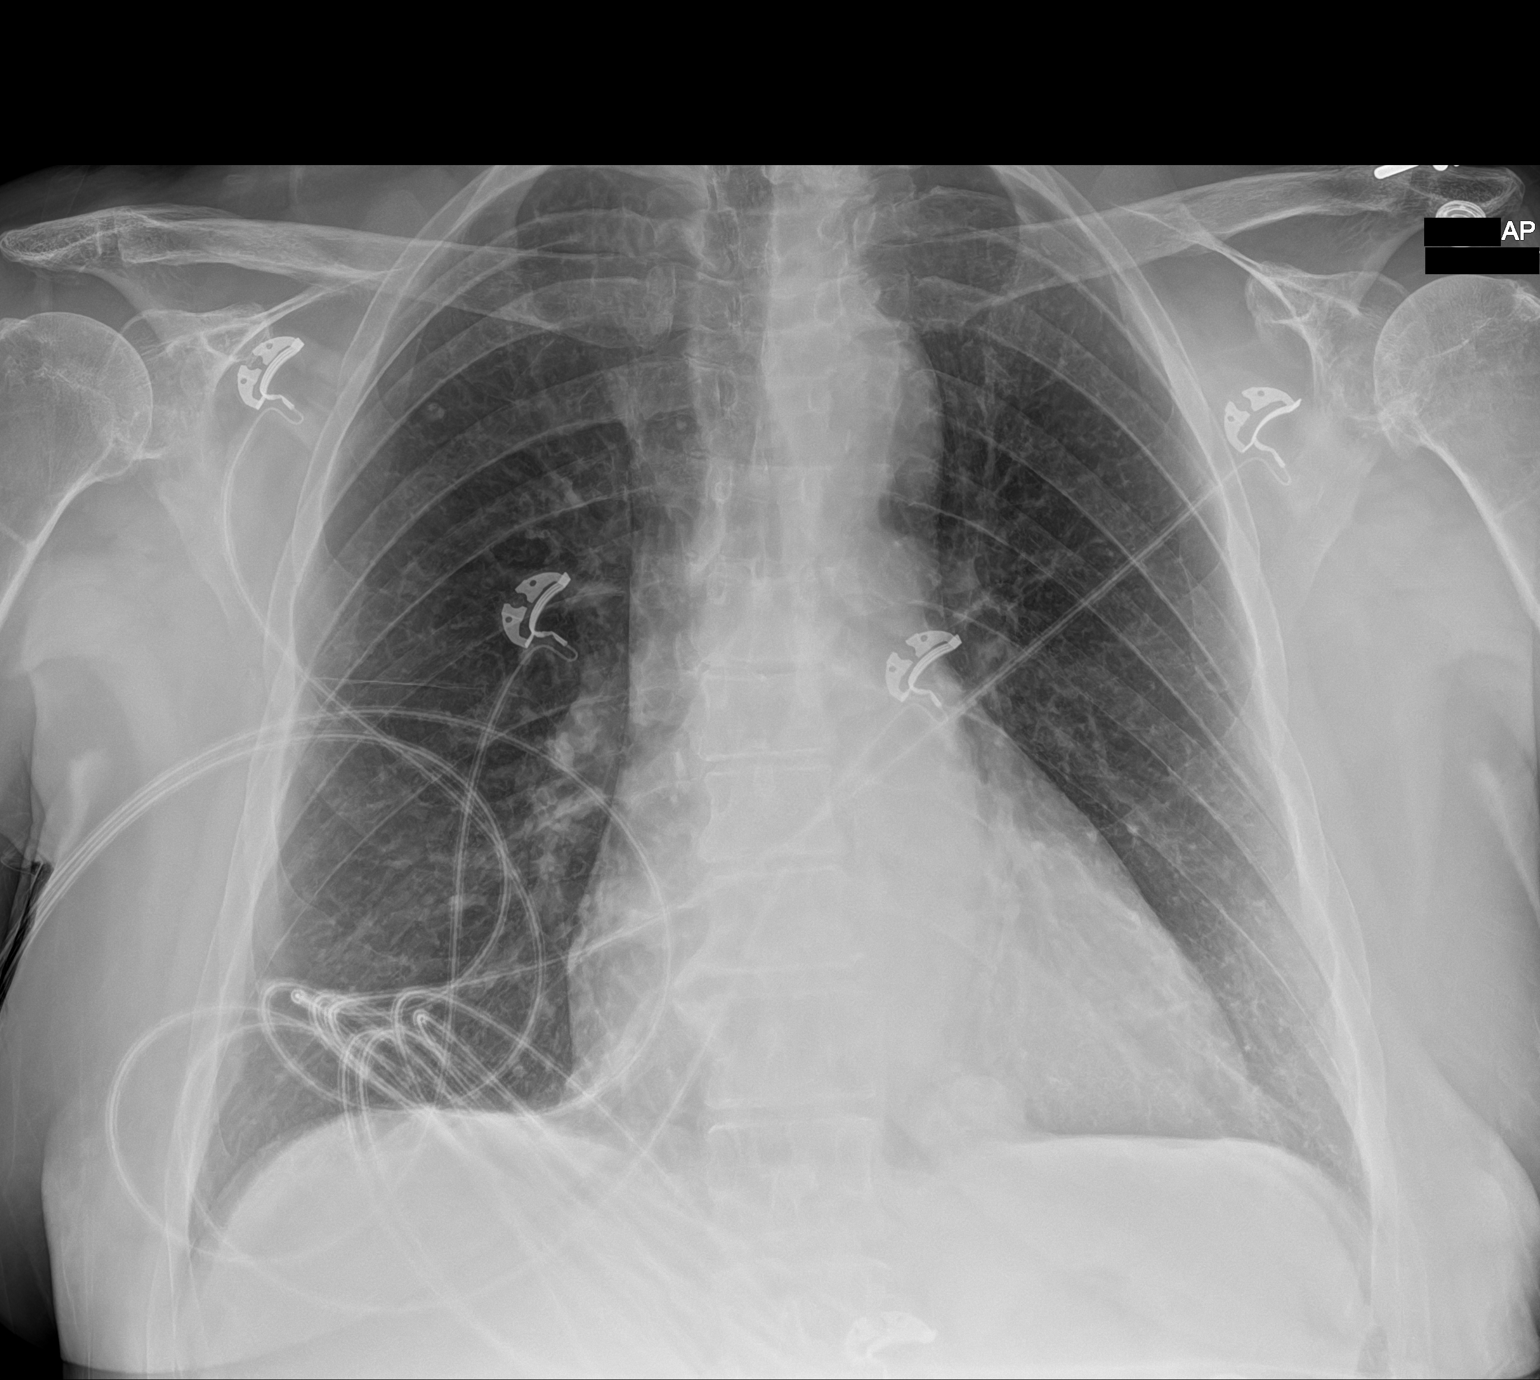

[1 of 1 positions shown; findings below may reference images not displayed]

FINDINGS: Unchanged cardiomediastinal silhouette. No focal airspace
consolidation. No large pleural effusion. No visible pneumothorax.
Cervical spine fusion hardware noted. Bilateral shoulder
osteoarthritis. Thoracic spondylosis. Fat containing left medial
basilar hernia as seen on recent CT.
IMPRESSION: No evidence of acute cardiopulmonary disease.
